# Patient Record
Sex: Female | Born: 1985 | Race: White | Hispanic: No | Smoking: Never smoker
Health system: Southern US, Community
[De-identification: ages and names within clinical notes are randomized; demographics above are authoritative.]

## PROBLEM LIST (undated history)

## (undated) DIAGNOSIS — Z789 Other specified health status: Secondary | ICD-10-CM

## (undated) HISTORY — PX: OTHER SURGICAL HISTORY: SHX169

---

## 2015-03-26 DIAGNOSIS — K8020 Calculus of gallbladder without cholecystitis without obstruction: Secondary | ICD-10-CM

## 2015-03-26 LAB — URINALYSIS W/ REFLEX CULTURE
Blood: NEGATIVE
Glucose: NEGATIVE mg/dL
Nitrites: POSITIVE — AB
Protein: NEGATIVE mg/dL
Specific gravity: 1.03 (ref 1.003–1.030)
Urobilinogen: 1 EU/dL (ref 0.2–1.0)
pH (UA): 6 (ref 5.0–8.0)

## 2015-03-26 LAB — METABOLIC PANEL, COMPREHENSIVE
A-G Ratio: 1 — ABNORMAL LOW (ref 1.1–2.2)
ALT (SGPT): 502 U/L — ABNORMAL HIGH (ref 12–78)
AST (SGOT): 361 U/L — ABNORMAL HIGH (ref 15–37)
Albumin: 4 g/dL (ref 3.5–5.0)
Alk. phosphatase: 220 U/L — ABNORMAL HIGH (ref 45–117)
Anion gap: 6 mmol/L (ref 5–15)
BUN/Creatinine ratio: 12 (ref 12–20)
BUN: 12 MG/DL (ref 6–20)
Bilirubin, total: 5.4 MG/DL — ABNORMAL HIGH (ref 0.2–1.0)
CO2: 30 mmol/L (ref 21–32)
Calcium: 9.2 MG/DL (ref 8.5–10.1)
Chloride: 102 mmol/L (ref 97–108)
Creatinine: 1.01 MG/DL (ref 0.55–1.02)
GFR est AA: >60 mL/min/{1.73_m2} (ref >60)
GFR est non-AA: >60 mL/min/{1.73_m2} (ref >60)
Globulin: 4.1 g/dL — ABNORMAL HIGH (ref 2.0–4.0)
Glucose: 99 mg/dL (ref 65–100)
Potassium: 4.8 mmol/L (ref 3.5–5.1)
Protein, total: 8.1 g/dL (ref 6.4–8.2)
Sodium: 138 mmol/L (ref 136–145)

## 2015-03-26 LAB — CBC WITH AUTOMATED DIFF
ABS. BASOPHILS: 0 10*3/uL (ref 0.0–0.1)
ABS. EOSINOPHILS: 0.2 10*3/uL (ref 0.0–0.4)
ABS. LYMPHOCYTES: 3.4 10*3/uL (ref 0.8–3.5)
ABS. MONOCYTES: 0.7 10*3/uL (ref 0.0–1.0)
ABS. NEUTROPHILS: 3.6 10*3/uL (ref 1.8–8.0)
BASOPHILS: 1 % (ref 0–1)
EOSINOPHILS: 2 % (ref 0–7)
HCT: 41.9 % (ref 35.0–47.0)
HGB: 13.6 g/dL (ref 11.5–16.0)
LYMPHOCYTES: 43 % (ref 12–49)
MCH: 29 PG (ref 26.0–34.0)
MCHC: 32.5 g/dL (ref 30.0–36.5)
MCV: 89.3 FL (ref 80.0–99.0)
MONOCYTES: 9 % (ref 5–13)
NEUTROPHILS: 45 % (ref 32–75)
PLATELET: 344 10*3/uL (ref 150–400)
RBC: 4.69 M/uL (ref 3.80–5.20)
RDW: 13.1 % (ref 11.5–14.5)
WBC: 7.8 10*3/uL (ref 3.6–11.0)

## 2015-03-26 LAB — MAGNESIUM: Magnesium: 2.4 mg/dL (ref 1.6–2.4)

## 2015-03-26 LAB — HCG URINE, QL: HCG urine, QL: NEGATIVE

## 2015-03-26 LAB — BILIRUBIN, CONFIRM: Bilirubin UA, confirm: POSITIVE — AB

## 2015-03-26 LAB — LIPASE: Lipase: 120 U/L (ref 73–393)

## 2015-03-26 NOTE — Consults (Signed)
Surgery      Called by PA in ER regarding this 29 yo female with nl wbc 7.8, bili of 5.4 with elevated lfts, with a US read as"GALLBLADDER: Contains small calculi and biliary sludge. Associated ??  gallbladder tenderness. Thickened gallbladder wall.  COMMON BILE DUCT: 11 mm in diameter.?? No evidence of choledocholithiasis."    Recommended on phone that this is most likely a CBD stone causing obstruction and that the patient will likely need an ERCP as the first step in her management and so GI needs to see patient and likely have patient admitted to Hospitalist and Surgical consult.    Patient seen.  Pain for 3 days.    Workup as above.    NKDA  No Meds  No Hx of surg  Recently moved here from [place] to live with sister, unemployed.  Neg Tob  Neg ETOH      ROS reports neg to all questions    PE: Abd soft, +BS, tender epigastrium to ruq, no guarding or rebound _ Murphy's sign    A/P  All evidence indicates CBD stone with obstruction  Do not feel the patient has acute cholecystitis and would not give antibx at this point  GI eval for ERCP vs MRCP and ERCP if positive.  Would suggest lap chole after CBD cleared unless elevation of LFT is related to a hepatitis    Will follow the patient and await GI workup.          Kelton PillarMichael S. Oletha CruelGrillon, MD, [facility]  [facility]

## 2015-03-26 NOTE — ED Provider Notes (Signed)
HPI Comments: Diane Nelson is a 29 y.o. female presenting ambulatory to ED c/o 9/10 upper abdominal pain R>L x two days. Pt states that the abdominal pain is "under her rib cage". Pt states that her pain is exacerbated when she is laying down or when she moves. Pt states she has had associated symptoms of nausea and vomiting (one episode) yesterday, which has become worse today. Pt denies any injury that could be causing her pain. Pt states that her bowel movements have been tan. Pt denies any history of abdominal problems or chronic medical problems. Pt states that her last menstrual period started five days ago and was normal. Pt states that she does not have a PCP because she is not from the area. She specifically denies any fevers, chills, chest pain, shortness of breath, headache, rash, sweating or weight loss.    Social Hx: - smoking, - alcohol use, - illicit drug use      There are no other complaints, changes, or physical findings at this time.  Written by Blaine Hamper Marijean Bravo, ED Scribe, as dictated by Augusto Garbe.     The history is provided by the patient. No language interpreter was used.        No past medical history on file.    No past surgical history on file.      No family history on file.    History     Social History   ??? Marital Status: SINGLE     Spouse Name: N/A   ??? Number of Children: N/A   ??? Years of Education: N/A     Occupational History   ??? Not on file.     Social History Main Topics   ??? Smoking status: Not on file   ??? Smokeless tobacco: Not on file   ??? Alcohol Use: Not on file   ??? Drug Use: Not on file   ??? Sexual Activity: Not on file     Other Topics Concern   ??? Not on file     Social History Narrative   ??? No narrative on file         ALLERGIES: Review of patient's allergies indicates no known allergies.    Review of Systems   Constitutional: Negative for fever, chills, appetite change and fatigue.   HENT: Negative for congestion, ear pain, postnasal drip, rhinorrhea and  sore throat.    Eyes: Negative for visual disturbance.   Respiratory: Negative for cough, shortness of breath and wheezing.    Cardiovascular: Negative for chest pain, palpitations and leg swelling.   Gastrointestinal: Positive for nausea, vomiting and abdominal pain (upper, R>L). Negative for diarrhea, constipation and anal bleeding.        "tan colored" bowel movements   Genitourinary: Negative for dysuria and hematuria.   Musculoskeletal: Negative for myalgias and arthralgias.   Skin: Negative for rash.   Allergic/Immunologic: Negative for immunocompromised state.   Neurological: Negative for weakness, light-headedness and headaches.   All other systems reviewed and are negative.      Patient Vitals for the past 12 hrs:   Temp Pulse Resp BP SpO2   03/27/15 0040 - (!) 53 14 96/46 mmHg 100 %   03/26/15 1730 97.8 ??F (36.6 ??C) (!) 58 16 142/89 mmHg 94 %            Physical Exam   Constitutional: She is oriented to person, place, and time. She appears well-developed and well-nourished. No distress.   HENT:  Head: Normocephalic and atraumatic.   Right Ear: External ear normal.   Left Ear: External ear normal.   Nose: Nose normal.   Mouth/Throat: Oropharynx is clear and moist.   Eyes: EOM are normal. Pupils are equal, round, and reactive to light.   Neck: Normal range of motion. Neck supple.   Cardiovascular: Normal rate, regular rhythm, normal heart sounds and intact distal pulses.  Exam reveals no gallop and no friction rub.    No murmur heard.  Pulmonary/Chest: Effort normal and breath sounds normal. No stridor. No respiratory distress. She has no wheezes. She has no rales. She exhibits no tenderness.   Abdominal: Soft. Bowel sounds are normal. She exhibits no distension and no mass. There is tenderness. There is no rebound and no guarding.   Diffuse upper abd pain- more focal to epigastric and RUQ. No rebounding or guarding. No CVAT   Musculoskeletal: Normal range of motion. She exhibits no edema or tenderness.    Neurological: She is alert and oriented to person, place, and time. She exhibits normal muscle tone. Coordination normal.   Skin: Skin is warm and dry. No rash noted. She is not diaphoretic. No erythema. No pallor.   Psychiatric: She has a normal mood and affect. Her behavior is normal.   Nursing note and vitals reviewed.       MDM  Number of Diagnoses or Management Options  Choledocholithiasis with acute cholecystitis:   Urinary tract infection, site unspecified:   Diagnosis management comments: DDx: cholecystitis, choledocholithiasis, UTI, constipation, viral syndrome, hepatitis       Amount and/or Complexity of Data Reviewed  Clinical lab tests: ordered and reviewed  Tests in the radiology section of CPT??: ordered and reviewed  Review and summarize past medical records: yes  Discuss the patient with other providers: yes (General surgery, GI, hospitalist)    Patient Progress  Patient progress: stable      Procedures   2216  Pt???s pain is improving with medication.    Consult Note:  11:23 PM  PA-C Maureen Chatters spoke with Dr. Winfred Leeds,  Specialty: General Surgery  Discussed pt's hx, disposition, and available diagnostic and imaging results. Reviewed care plans. Consultant agrees with plans as outlined. Dr. Winfred Leeds would like for GI to be consulted and would like the pt to be admitted by the hospitalist. Dr. Winfred Leeds would like an MRCP for the pt.   Written by Blaine Hamper Marijean Bravo, ED Scribe, as dictated by Augusto Garbe.     Consult Note:  11:33 PM  PA-C Maureen Chatters spoke with Dr. Chancy Milroy,  Specialty: GI  Discussed pt's hx, disposition, and available diagnostic and imaging results. Reviewed care plans. Consultant agrees with plans as outlined. Dr. Chancy Milroy states that he will see the pt and recommends admission to the hospitalist.  Written by Blaine Hamper. Marijean Bravo, ED Scribe, as dictated by Augusto Garbe.     CONSULT NOTE:   11:35 PM  PA-C Maureen Chatters spoke with Dr. Posey Pronto,   Specialty: Hospitalist   Discussed pt's hx, disposition, and available diagnostic and imaging results. Reviewed care plans. Consultant believes that General Surgery should admit the pt.  Written by Molli Knock, ED Scribe, as dictated by Augusto Garbe.    11:43 PM   PA-C Maureen Chatters has discussed admission with the hospitalist and General surgery and has advised that they discuss the admission problem amongst themselves.     LABORATORY TESTS:  Recent Results (from the past 12 hour(s))   CBC WITH  AUTOMATED DIFF    Collection Time: 03/26/15  5:54 PM   Result Value Ref Range    WBC 7.8 3.6 - 11.0 K/uL    RBC 4.69 3.80 - 5.20 M/uL    HGB 13.6 11.5 - 16.0 g/dL    HCT 41.9 35.0 - 47.0 %    MCV 89.3 80.0 - 99.0 FL    MCH 29.0 26.0 - 34.0 PG    MCHC 32.5 30.0 - 36.5 g/dL    RDW 13.1 11.5 - 14.5 %    PLATELET 344 150 - 400 K/uL    NEUTROPHILS 45 32 - 75 %    LYMPHOCYTES 43 12 - 49 %    MONOCYTES 9 5 - 13 %    EOSINOPHILS 2 0 - 7 %    BASOPHILS 1 0 - 1 %    ABS. NEUTROPHILS 3.6 1.8 - 8.0 K/UL    ABS. LYMPHOCYTES 3.4 0.8 - 3.5 K/UL    ABS. MONOCYTES 0.7 0.0 - 1.0 K/UL    ABS. EOSINOPHILS 0.2 0.0 - 0.4 K/UL    ABS. BASOPHILS 0.0 0.0 - 0.1 K/UL   METABOLIC PANEL, COMPREHENSIVE    Collection Time: 03/26/15  5:54 PM   Result Value Ref Range    Sodium 138 136 - 145 mmol/L    Potassium 4.8 3.5 - 5.1 mmol/L    Chloride 102 97 - 108 mmol/L    CO2 30 21 - 32 mmol/L    Anion gap 6 5 - 15 mmol/L    Glucose 99 65 - 100 mg/dL    BUN 12 6 - 20 MG/DL    Creatinine 1.01 0.55 - 1.02 MG/DL    BUN/Creatinine ratio 12 12 - 20      GFR est AA >60 >60 ml/min/1.103m    GFR est non-AA >60 >60 ml/min/1.738m   Calcium 9.2 8.5 - 10.1 MG/DL    Bilirubin, total 5.4 (H) 0.2 - 1.0 MG/DL    ALT 502 (H) 12 - 78 U/L    AST 361 (H) 15 - 37 U/L    Alk. phosphatase 220 (H) 45 - 117 U/L    Protein, total 8.1 6.4 - 8.2 g/dL    Albumin 4.0 3.5 - 5.0 g/dL    Globulin 4.1 (H) 2.0 - 4.0 g/dL    A-G Ratio 1.0 (L) 1.1 - 2.2     LIPASE    Collection Time: 03/26/15  5:54 PM    Result Value Ref Range    Lipase 120 73 - 393 U/L   MAGNESIUM    Collection Time: 03/26/15  5:54 PM   Result Value Ref Range    Magnesium 2.4 1.6 - 2.4 mg/dL   URINALYSIS W/ REFLEX CULTURE    Collection Time: 03/26/15  6:27 PM   Result Value Ref Range    Color AMBER      Appearance CLOUDY (A) CLEAR      Specific gravity 1.030 1.003 - 1.030      pH (UA) 6.0 5.0 - 8.0      Protein NEGATIVE  NEG mg/dL    Glucose NEGATIVE  NEG mg/dL    Ketone TRACE (A) NEG mg/dL    Blood NEGATIVE  NEG      Urobilinogen 1.0 0.2 - 1.0 EU/dL    Nitrites POSITIVE (A) NEG      Leukocyte Esterase SMALL (A) NEG      WBC 5-10 0 - 4 /hpf    RBC 0-5 0 - 5 /hpf  Epithelial cells FEW FEW /lpf    Bacteria 1+ (A) NEG /hpf    UA:UC IF INDICATED URINE CULTURE ORDERED (A) CNI      CA Oxalate Crystals 2+ (A) NEG   HCG URINE, QL    Collection Time: 03/26/15  6:27 PM   Result Value Ref Range    HCG urine, Ql. NEGATIVE  NEG     BILIRUBIN, CONFIRM    Collection Time: 03/26/15  6:27 PM   Result Value Ref Range    Bilirubin UA, confirm POSITIVE (A) NEG         IMAGING RESULTS:  ?? [place] ABD COMP (Final result) Result time: 03/26/15 23:18:04   ?? Final result by Rad Results In Edi (03/26/15 23:18:04)   ?? Narrative:   ?? **Final Report**  ??    ICD Codes / Adm.Diagnosis: 353614 ?? / Abdominal Pain ??Abdominal Pain  Examination: ??US ABDOMEN ??- [id number] - Jul ??5 2016 11:06PM  Accession No: ??[id number]  Reason: ??Abdominal pain      REPORT:  INDICATION: Abdominal pain    TECHNIQUE:  Routine ultrasound images of the abdomen were obtained.     FINDINGS:  GALLBLADDER: Contains small calculi and biliary sludge. Associated   gallbladder tenderness. Thickened gallbladder wall.  COMMON BILE DUCT: 11 mm in diameter. ??No evidence of choledocholithiasis.  LIVER: Normal in size. Normal echogenicity. No focal hepatic mass or   intrahepatic biliary dilatation.   MAIN PORTAL VEIN: Patent. ??Appropriate hepatopetal flow direction.  PANCREAS: No evidence of mass or ductal dilatation.   RIGHT KIDNEY: 10.3 cm in length. ??No hydronephrosis. No evidence of mass or   calculus.  LEFT KIDNEY: 9.4 cm in length. ??No hydronephrosis. No evidence of mass or   calculus.  SPLEEN: 11.9 cm in length. ??No mass.  AORTA: Normal.  INFERIOR VENA CAVA: Patent.  ????    IMPRESSION:   Cholecystolithiasis, with associated gallbladder tenderness and gallbladder   wall thickening, suggestive of acute cholecystitis. Dilated common bile   duct, with no visualized choledocholithiasis.  ????    ??  ??  Signing/Reading Doctor: Eliseo Squires [phone]) ??  ApprovedEliseo Squires [phone]) ??Jul ??5 2016 11:15PM ?? ?? ?? ?? ?? ?? ?? ?? ?? ?? ??            MEDICATIONS GIVEN:  Medications   sodium chloride 0.9 % bolus infusion 1,000 mL (1,000 mL IntraVENous New Bag 03/26/15 2216)   morphine injection 4 mg (4 mg IntraVENous Given 03/26/15 2226)   ondansetron (ZOFRAN) injection 4 mg (4 mg IntraVENous Given 03/26/15 2219)   cefTRIAXone (ROCEPHIN) 1 g in 0.9% sodium chloride (MBP/ADV) 50 mL (1 g IntraVENous New Bag 03/26/15 2216)       IMPRESSION:  1. Choledocholithiasis with acute cholecystitis    2. Urinary tract infection, site unspecified    3. Calculus of gallbladder with biliary obstruction but without cholecystitis    4. LFT elevation        PLAN:  1. Admit to either General Surgery (Dr. Winfred Leeds) or the hospitalist    Admit Note:  1:06 AM  Patient is being admitted to the hospital by either General Surgery or the hospitalist. The results of their tests and reasons for their admission have been discussed with the patient and/or available family. They convey agreement and understanding for the need to be admitted and for their admission diagnosis.   Written by Blaine Hamper Marijean Bravo, ED Scribe, as dictated by Augusto Garbe.  This note is prepared by Jerene Pitch A. Ford, acting as Education administrator for [employer].    PA-C Thayer Headings Manav Pierotti: The scribe's documentation has been prepared under my direction and personally reviewed by me in its entirety. I confirm that  the note above accurately reflects all work, treatment, procedures, and medical decision making performed by me.

## 2015-03-26 NOTE — ED Notes (Signed)
Assumed care of pt from triage.  Pt presents to ED with chief complaint of upper abdominal pain x 2 days. Pt states she feels as it "someone is punching her in the stomach". Pt also reports N/V. Pt states when she takes a deep breath in, the pain gets worse. Pt denies CP.  Pt is A&O x 4.  Pt denies any other symptoms at this time.    Pt resting comfortably on the stretcher in a position of comfort.  Pt in no acute distress at this time.  Call bell within reach.  Side rails x 2.  Cardiac monitor x 3.  Stretcher locked in the lowest position. Pt aware of plan to await for MD/PA-C/NP assessment, and pt/family verbalizes understanding.  Will continue to monitor.

## 2015-03-27 ENCOUNTER — Inpatient Hospital Stay
Admit: 2015-03-27 | Discharge: 2015-04-02 | Disposition: A | Discharge status: Home / Self Care | Payer: MEDICARE | Attending: Family Medicine | Admitting: Family Medicine

## 2015-03-27 LAB — CBC WITH AUTOMATED DIFF
ABS. BASOPHILS: 0.1 10*3/uL (ref 0.0–0.1)
ABS. EOSINOPHILS: 0.1 10*3/uL (ref 0.0–0.4)
ABS. LYMPHOCYTES: 3.4 10*3/uL (ref 0.8–3.5)
ABS. MONOCYTES: 0.5 10*3/uL (ref 0.0–1.0)
ABS. NEUTROPHILS: 2.7 10*3/uL (ref 1.8–8.0)
BASOPHILS: 1 % (ref 0–1)
EOSINOPHILS: 2 % (ref 0–7)
HCT: 45.5 % (ref 35.0–47.0)
HGB: 12.5 g/dL (ref 11.5–16.0)
LYMPHOCYTES: 51 % — ABNORMAL HIGH (ref 12–49)
MCH: 29.9 PG (ref 26.0–34.0)
MCHC: 27.5 g/dL — ABNORMAL LOW (ref 30.0–36.5)
MCV: 108.9 FL — ABNORMAL HIGH (ref 80.0–99.0)
MONOCYTES: 7 % (ref 5–13)
NEUTROPHILS: 39 % (ref 32–75)
PLATELET: 278 10*3/uL (ref 150–400)
RBC: 4.18 M/uL (ref 3.80–5.20)
RDW: 14.4 % (ref 11.5–14.5)
WBC COMMENTS: REACTIVE
WBC: 6.8 10*3/uL (ref 3.6–11.0)

## 2015-03-27 LAB — METABOLIC PANEL, COMPREHENSIVE
A-G Ratio: 0.9 — ABNORMAL LOW (ref 1.1–2.2)
ALT (SGPT): 466 U/L — ABNORMAL HIGH (ref 12–78)
AST (SGOT): 336 U/L — ABNORMAL HIGH (ref 15–37)
Albumin: 3.4 g/dL — ABNORMAL LOW (ref 3.5–5.0)
Alk. phosphatase: 212 U/L — ABNORMAL HIGH (ref 45–117)
Anion gap: 4 mmol/L — ABNORMAL LOW (ref 5–15)
BUN/Creatinine ratio: 10 — ABNORMAL LOW (ref 12–20)
BUN: 8 MG/DL (ref 6–20)
Bilirubin, total: 4.4 MG/DL — ABNORMAL HIGH (ref 0.2–1.0)
CO2: 30 mmol/L (ref 21–32)
Calcium: 8.3 MG/DL — ABNORMAL LOW (ref 8.5–10.1)
Chloride: 109 mmol/L — ABNORMAL HIGH (ref 97–108)
Creatinine: 0.78 MG/DL (ref 0.55–1.02)
GFR est AA: >60 mL/min/{1.73_m2} (ref >60)
GFR est non-AA: >60 mL/min/{1.73_m2} (ref >60)
Globulin: 3.8 g/dL (ref 2.0–4.0)
Glucose: 69 mg/dL (ref 65–100)
Potassium: 4.3 mmol/L (ref 3.5–5.1)
Protein, total: 7.2 g/dL (ref 6.4–8.2)
Sodium: 143 mmol/L (ref 136–145)

## 2015-03-27 MED ORDER — MORPHINE 2 MG/ML INJECTION
2 mg/mL | INTRAMUSCULAR | Status: AC
Start: 2015-03-27 — End: 2015-03-26
  Administered 2015-03-27: 02:00:00 via INTRAVENOUS

## 2015-03-27 MED ORDER — POLYETHYLENE GLYCOL 3350 17 GRAM (100 %) ORAL POWDER PACKET
17 gram | Freq: Every day | ORAL | Status: DC
Start: 2015-03-27 — End: 2015-04-02
  Administered 2015-03-29 – 2015-03-31 (×3): via ORAL

## 2015-03-27 MED ORDER — ONDANSETRON (PF) 4 MG/2 ML INJECTION
4 mg/2 mL | INTRAMUSCULAR | Status: AC
Start: 2015-03-27 — End: 2015-03-26
  Administered 2015-03-27: 02:00:00 via INTRAVENOUS

## 2015-03-27 MED ORDER — SODIUM CHLORIDE 0.9 % IJ SYRG
Freq: Three times a day (TID) | INTRAMUSCULAR | Status: DC
Start: 2015-03-27 — End: 2015-04-02
  Administered 2015-03-27 – 2015-04-02 (×21): via INTRAVENOUS

## 2015-03-27 MED ORDER — METRONIDAZOLE IN SODIUM CHLORIDE (ISO-OSM) 500 MG/100 ML IV PIGGY BACK
500 mg/100 mL | Freq: Three times a day (TID) | INTRAVENOUS | Status: DC
Start: 2015-03-27 — End: 2015-03-30
  Administered 2015-03-27 – 2015-03-30 (×9): via INTRAVENOUS

## 2015-03-27 MED ORDER — DEXTROSE 5% IN NORMAL SALINE IV
INTRAVENOUS | Status: DC
Start: 2015-03-27 — End: 2015-04-02
  Administered 2015-03-27 – 2015-04-02 (×9): via INTRAVENOUS

## 2015-03-27 MED ORDER — SODIUM CHLORIDE 0.9% BOLUS IV
0.9 % | INTRAVENOUS | Status: AC
Start: 2015-03-27 — End: 2015-03-27
  Administered 2015-03-27: 02:00:00 via INTRAVENOUS

## 2015-03-27 MED ORDER — SODIUM CHLORIDE 0.9 % IV
INTRAVENOUS | Status: DC
Start: 2015-03-27 — End: 2015-03-27
  Administered 2015-03-27: 07:00:00 via INTRAVENOUS

## 2015-03-27 MED ORDER — ADV ADDAPTOR
1 gram | Status: AC
Start: 2015-03-27 — End: 2015-03-27
  Administered 2015-03-27: 02:00:00 via INTRAVENOUS

## 2015-03-27 MED ORDER — SODIUM CHLORIDE 0.9 % IV PIGGY BACK
1 gram | INTRAVENOUS | Status: DC
Start: 2015-03-27 — End: 2015-03-30
  Administered 2015-03-28 – 2015-03-30 (×3): via INTRAVENOUS

## 2015-03-27 MED ORDER — SODIUM CHLORIDE 0.9 % IJ SYRG
INTRAMUSCULAR | Status: DC | PRN
Start: 2015-03-27 — End: 2015-04-02

## 2015-03-27 MED ORDER — MORPHINE 2 MG/ML INJECTION
2 mg/mL | Freq: Four times a day (QID) | INTRAMUSCULAR | Status: DC | PRN
Start: 2015-03-27 — End: 2015-03-28
  Administered 2015-03-28: 23:00:00 via INTRAVENOUS

## 2015-03-27 MED FILL — SODIUM CHLORIDE 0.9 % IV: INTRAVENOUS | Qty: 1000

## 2015-03-27 MED FILL — DEXTROSE 5% IN NORMAL SALINE IV: INTRAVENOUS | Qty: 1000

## 2015-03-27 MED FILL — ONDANSETRON (PF) 4 MG/2 ML INJECTION: 4 mg/2 mL | INTRAMUSCULAR | Qty: 2

## 2015-03-27 MED FILL — BD POSIFLUSH NORMAL SALINE 0.9 % INJECTION SYRINGE: INTRAMUSCULAR | Qty: 10

## 2015-03-27 MED FILL — METRONIDAZOLE IN SODIUM CHLORIDE (ISO-OSM) 500 MG/100 ML IV PIGGY BACK: 500 mg/100 mL | INTRAVENOUS | Qty: 100

## 2015-03-27 MED FILL — MORPHINE 2 MG/ML INJECTION: 2 mg/mL | INTRAMUSCULAR | Qty: 2

## 2015-03-27 MED FILL — CEFTRIAXONE 1 GRAM SOLUTION FOR INJECTION: 1 gram | INTRAMUSCULAR | Qty: 1

## 2015-03-27 NOTE — ED Notes (Signed)
Sister Diane Nelson phone number [phone]

## 2015-03-27 NOTE — Progress Notes (Addendum)
GI Consultation Note Diane Nelson)    NAME: Diane Nelson DOB: [birth year] MRN: [id number]   ATTG: Dr. Thayer Dallas PCP: None  Date/Time:  03/27/2015 8:36 AM  Subjective:   REASON FOR CONSULT:        Diane Nelson is a 29 y.o.  W  female who I was asked to see for suspected biliary obstruction, elevated LFTs and cholecystitis.   Diane Nelson reports a few months of relapsing epigastric and RUQ pain, which became extremely severe last night. She presented to the ED for management, where US showed a CBD of 11 mm w/o visualized choledocholithiasis and cholelithiasis, positive [place] Murphy's, and GB wall thickening. Her pain is better this AM, and bili has downtrended slightly. She has some constipation, but denies diarrhea, nausea, vomiting, fevers, chills.       No past medical history on file.   No past surgical history on file.  History   Substance Use Topics   ??? Smoking status: Not on file   ??? Smokeless tobacco: Not on file   ??? Alcohol Use: Not on file      No family history on file.   No Known Allergies   Home Medications:  None     Hospital medications:  Current Facility-Administered Medications   Medication Dose Route Frequency   ??? sodium chloride (NS) flush 5-10 mL  5-10 mL IntraVENous Q8H   ??? sodium chloride (NS) flush 5-10 mL  5-10 mL IntraVENous PRN   ??? metroNIDAZOLE (FLAGYL) IVPB premix 500 mg  500 mg IntraVENous Q8H   ??? cefTRIAXone (ROCEPHIN) 1 g in 0.9% sodium chloride (MBP/ADV) 50 mL  1 g IntraVENous Q24H   ??? morphine injection 1 mg  1 mg IntraVENous Q6H PRN   ??? 0.9% sodium chloride infusion  125 mL/hr IntraVENous CONTINUOUS     REVIEW OF SYSTEMS:          Unable to obtain  ROS due to      mental status change      sedated       intubated       Total of 11 systems reviewed as follows:  Const:  negative fever, negative chills, negative weight loss  Eyes:   negative diplopia or visual changes, negative eye pain  ENT:   negative coryza, negative sore throat  Resp:   negative cough, hemoptysis, dyspnea   Cards:  negative for chest pain, palpitations, lower extremity edema  GU:  negative for frequency, dysuria and hematuria  Skin:   negative for rash and pruritus  Heme:  negative for easy bruising and gum/nose bleeding  MS:  negative for myalgias, arthralgias, back pain and muscle weakness  Neurolo:  negative for headaches, dizziness, vertigo, memory problems   Psych:  negative for feelings of anxiety, depression     Pertinent Positives include : see HPI     Objective:   VITALS:    BP 99/49 mmHg   Pulse 59   Temp(Src) 97.6 ??F (36.4 ??C)   Resp 16   Ht 5' 6"  (1.676 m)   Wt 81.5 kg (179 lb 10.8 oz)   BMI 29.01 kg/m2   SpO2 99%   LMP 03/24/2015 (Exact Date)  Temp (24hrs), Avg:97.6 ??F (36.4 ??C), Min:97.4 ??F (36.3 ??C), Max:97.8 ??F (36.6 ??C)    PHYSICAL EXAM:   General:    Sleepy, recent morphine, cooperative, no distress, appears stated age.     Head:   Normocephalic, without obvious abnormality, atraumatic.  Eyes:   Conjunctivae clear, anicteric  sclerae.  Pupils are equal  Nose:  Nares normal. No drainage or sinus tenderness.  Throat:    Lips, mucosa, and tongue normal.  No Thrush  Neck:  Supple, symmetrical,  no adenopathy, thyroid: non tender  Back:    Symmetric,  No CVA tenderness.  Lungs:   CTA bilaterally.  No wheezing/rhonchi/rales.  Chest wall:  No tenderness or deformity. No Accessory muscle use.  Heart:   Regular rate and rhythm,  no murmur, rub or gallop.  Abdomen:   Soft, tender to palpation in epigastric/RUQ, NABS  Extremities: Atraumatic, No cyanosis.  No edema. No clubbing  Skin:     Texture, turgor normal. No rashes/lesions/jaundice  Lymph: Cervical, supraclavicular normal.  Psych:  Fair insight.  Not depressed.  Not anxious or agitated.  Neurologic: EOMs intact. No facial asymmetry. No aphasia or slurred speech. Normal strength, A/O X 3.     LAB DATA REVIEWED:    Recent Results (from the past 48 hour(s))   CBC WITH AUTOMATED DIFF    Collection Time: 03/26/15  5:54 PM   Result Value Ref Range     WBC 7.8 3.6 - 11.0 K/uL    RBC 4.69 3.80 - 5.20 M/uL    HGB 13.6 11.5 - 16.0 g/dL    HCT 41.9 35.0 - 47.0 %    MCV 89.3 80.0 - 99.0 FL    MCH 29.0 26.0 - 34.0 PG    MCHC 32.5 30.0 - 36.5 g/dL    RDW 13.1 11.5 - 14.5 %    PLATELET 344 150 - 400 K/uL    NEUTROPHILS 45 32 - 75 %    LYMPHOCYTES 43 12 - 49 %    MONOCYTES 9 5 - 13 %    EOSINOPHILS 2 0 - 7 %    BASOPHILS 1 0 - 1 %    ABS. NEUTROPHILS 3.6 1.8 - 8.0 K/UL    ABS. LYMPHOCYTES 3.4 0.8 - 3.5 K/UL    ABS. MONOCYTES 0.7 0.0 - 1.0 K/UL    ABS. EOSINOPHILS 0.2 0.0 - 0.4 K/UL    ABS. BASOPHILS 0.0 0.0 - 0.1 K/UL   METABOLIC PANEL, COMPREHENSIVE    Collection Time: 03/26/15  5:54 PM   Result Value Ref Range    Sodium 138 136 - 145 mmol/L    Potassium 4.8 3.5 - 5.1 mmol/L    Chloride 102 97 - 108 mmol/L    CO2 30 21 - 32 mmol/L    Anion gap 6 5 - 15 mmol/L    Glucose 99 65 - 100 mg/dL    BUN 12 6 - 20 MG/DL    Creatinine 1.01 0.55 - 1.02 MG/DL    BUN/Creatinine ratio 12 12 - 20      GFR est AA >60 >60 ml/min/1.66m    GFR est non-AA >60 >60 ml/min/1.716m   Calcium 9.2 8.5 - 10.1 MG/DL    Bilirubin, total 5.4 (H) 0.2 - 1.0 MG/DL    ALT 502 (H) 12 - 78 U/L    AST 361 (H) 15 - 37 U/L    Alk. phosphatase 220 (H) 45 - 117 U/L    Protein, total 8.1 6.4 - 8.2 g/dL    Albumin 4.0 3.5 - 5.0 g/dL    Globulin 4.1 (H) 2.0 - 4.0 g/dL    A-G Ratio 1.0 (L) 1.1 - 2.2     LIPASE    Collection Time: 03/26/15  5:54 PM   Result Value Ref Range  Lipase 120 73 - 393 U/L   MAGNESIUM    Collection Time: 03/26/15  5:54 PM   Result Value Ref Range    Magnesium 2.4 1.6 - 2.4 mg/dL   URINALYSIS W/ REFLEX CULTURE    Collection Time: 03/26/15  6:27 PM   Result Value Ref Range    Color AMBER      Appearance CLOUDY (A) CLEAR      Specific gravity 1.030 1.003 - 1.030      pH (UA) 6.0 5.0 - 8.0      Protein NEGATIVE  NEG mg/dL    Glucose NEGATIVE  NEG mg/dL    Ketone TRACE (A) NEG mg/dL    Blood NEGATIVE  NEG      Urobilinogen 1.0 0.2 - 1.0 EU/dL    Nitrites POSITIVE (A) NEG       Leukocyte Esterase SMALL (A) NEG      WBC 5-10 0 - 4 /hpf    RBC 0-5 0 - 5 /hpf    Epithelial cells FEW FEW /lpf    Bacteria 1+ (A) NEG /hpf    UA:UC IF INDICATED URINE CULTURE ORDERED (A) CNI      CA Oxalate Crystals 2+ (A) NEG   HCG URINE, QL    Collection Time: 03/26/15  6:27 PM   Result Value Ref Range    HCG urine, Ql. NEGATIVE  NEG     BILIRUBIN, CONFIRM    Collection Time: 03/26/15  6:27 PM   Result Value Ref Range    Bilirubin UA, confirm POSITIVE (A) NEG     CBC WITH AUTOMATED DIFF    Collection Time: 03/27/15  5:26 AM   Result Value Ref Range    WBC 6.8 3.6 - 11.0 K/uL    RBC 4.18 3.80 - 5.20 M/uL    HGB 12.5 11.5 - 16.0 g/dL    HCT 45.5 35.0 - 47.0 %    MCV 108.9 (H) 80.0 - 99.0 FL    MCH 29.9 26.0 - 34.0 PG    MCHC 27.5 (L) 30.0 - 36.5 g/dL    RDW 14.4 11.5 - 14.5 %    PLATELET 278 150 - 400 K/uL    NEUTROPHILS 39 32 - 75 %    LYMPHOCYTES 51 (H) 12 - 49 %    MONOCYTES 7 5 - 13 %    EOSINOPHILS 2 0 - 7 %    BASOPHILS 1 0 - 1 %    ABS. NEUTROPHILS 2.7 1.8 - 8.0 K/UL    ABS. LYMPHOCYTES 3.4 0.8 - 3.5 K/UL    ABS. MONOCYTES 0.5 0.0 - 1.0 K/UL    ABS. EOSINOPHILS 0.1 0.0 - 0.4 K/UL    ABS. BASOPHILS 0.1 0.0 - 0.1 K/UL    RBC COMMENTS MACROCYTOSIS  PRESENT        WBC COMMENTS REACTIVE LYMPHS      DF SMEAR SCANNED     METABOLIC PANEL, COMPREHENSIVE    Collection Time: 03/27/15  5:26 AM   Result Value Ref Range    Sodium 143 136 - 145 mmol/L    Potassium 4.3 3.5 - 5.1 mmol/L    Chloride 109 (H) 97 - 108 mmol/L    CO2 30 21 - 32 mmol/L    Anion gap 4 (L) 5 - 15 mmol/L    Glucose 69 65 - 100 mg/dL    BUN 8 6 - 20 MG/DL    Creatinine 0.78 0.55 - 1.02 MG/DL    BUN/Creatinine ratio  10 (L) 12 - 20      GFR est AA >60 >60 ml/min/1.1m    GFR est non-AA >60 >60 ml/min/1.757m   Calcium 8.3 (L) 8.5 - 10.1 MG/DL    Bilirubin, total 4.4 (H) 0.2 - 1.0 MG/DL    ALT 466 (H) 12 - 78 U/L    AST 336 (H) 15 - 37 U/L    Alk. phosphatase 212 (H) 45 - 117 U/L    Protein, total 7.2 6.4 - 8.2 g/dL    Albumin 3.4 (L) 3.5 - 5.0 g/dL     Globulin 3.8 2.0 - 4.0 g/dL    A-G Ratio 0.9 (L) 1.1 - 2.2       IMAGING RESULTS:  [place]Q:   "FINDINGS:  GALLBLADDER: Contains small calculi and biliary sludge. Associated ??  gallbladder tenderness. Thickened gallbladder wall.  COMMON BILE DUCT: 11 mm in diameter.?? No evidence of choledocholithiasis.  LIVER: Normal in size. Normal echogenicity. No focal hepatic mass or ??  intrahepatic biliary dilatation. ??  MAIN PORTAL VEIN: Patent.?? Appropriate hepatopetal flow direction.  PANCREAS: No evidence of mass or ductal dilatation.  RIGHT KIDNEY: 10.3 cm in length.?? No hydronephrosis. No evidence of mass or ??  calculus.  LEFT KIDNEY: 9.4 cm in length.?? No hydronephrosis. No evidence of mass or ??  calculus.  SPLEEN: 11.9 cm in length.?? No mass.  AORTA: Normal.  INFERIOR VENA CAVA: Patent  ??  IMPRESSION: ??  Cholecystolithiasis, with associated gallbladder tenderness and gallbladder ??  wall thickening, suggestive of acute cholecystitis. Dilated common bile ??  duct, with no visualized choledocholithiasis."    Assessment/Plan:      Active Problems:    Cholelithiasis (03/26/2015)      LFT elevation (03/26/2015)    89o F with suspected cholecystitis, with likely prior vs current CBD obstruction. Bili has downtrended and aminotransferases down a little too. CBD stone may have passed, but will need to clarify with MRCP. No fever, no AMS, stable hemodynamics on prn morphine.  ___________________________________________________  RECOMMENDATIONS:      -- agree with empiric abx  -- MRCP ordered to clarify if ERCP needed  -- follow LFTs   -- suspect will need cholecystectomy, after ERCP if required (I did review what an ERCP entails, risks, benefits with patient at bedside today)    Discussed Code Status:        Full Code          DNR    ___________________________________________________  Care Plan discussed with:        Patient       Family       Nursing       Attending  Total Time :  30   minutes    ___________________________________________________  GI: AlMacie BurowsMD

## 2015-03-27 NOTE — Other (Signed)
[facility] Telemetry Unit Interdisciplinary rounds were held today to discuss patient plan of care and outcomes. The interdisciplinary team collaborated to facilitate discharge planning needs. The following members were present; Nurse Manager, Clinical Care Lead, Pharmacist, Primary Nurse, , Physical Therapy,   Physician, and Case Management.     PLAN:  MRCP  Discharge home when ready

## 2015-03-27 NOTE — ED Notes (Signed)
Hourly rounds completed. Concerns and questions addressed at this time. Pt in no acute distress at this time. Call bell within reach. Side rails x 2. Cardiac Monitor x 3. Stretcher locked in lowest position.

## 2015-03-27 NOTE — Progress Notes (Signed)
Surgery      Prelim MRCP report"Small gallstones without evidence for choledocholithiasis"    Hopefully final report by AM    Check LFT in AM    If correcting and final MRCP shows no evidence of CBD stone then will sched for LAP Chole.    NPO after MN      Kelton PillarMichael S. Oletha CruelGrillon,  MD, [facility]  [facility]

## 2015-03-27 NOTE — Progress Notes (Signed)
Initial Nutrition Assessment:    INTERVENTIONS/RECOMMENDATIONS:   ?? Meals/Snacks: General/healthful diet: Advance diet as medically able.     ASSESSMENT:   Patient medically noted for common bile duct obstruction, cholelithiasis, and elevated LFT's. Patient currently NPO. She reports feeling hungry this morning. Poor appetite noted PTA with reported 15# weight loss x approximately 3 months. Will monitor for diet advancement and need for ONS.     Diet Order: NPO  % Eaten:  No data found.    Pertinent Medications: Reviewed Other: Flagyl  Pertinent Labs: Reviewed Other  Food Allergies: None Other   Last BM:    Active     Hyperactive  Hypoactive        Absent BS  Skin:     Intact    Incision   Breakdown   Other:    Anthropometrics:   Height:  (167.6 cm) Weight: 81.5 kg (179 lb 10.8 oz)   IBW (%IBW):   ( ) UBW (%UBW):   (  %)   Last Weight Metrics:  Weight Loss Metrics 03/26/2015   Today's Wt 179 lb 10.8 oz   BMI 29.01 kg/m2       BMI: Body mass index is 29.01 kg/(m^2).    This BMI is indicative of:   Underweight    Normal    Overweight     Obesity    Extreme Obesity (BMI>40)     Estimated Nutrition Needs (Based on):   1800 Kcals/day (BMR (1577) x 1.3AF -250kcal) , 66 g (0.8 g/kg bw) Protein  Carbohydrate: At Least 130 g/day  Fluids: 1800 mL/day (86ml/kcal)    Pt expected to meet estimated nutrient needs: Yes No: currently NPO    NUTRITION DIAGNOSES:   Problem:  Inadequate protein-energy intake Unintended weight loss    Etiology: related to current medical condition poor appetite    Signs/Symptoms: as evidenced by NPO status  reported 15# weight loss x 3 months (7.7%)    NUTRITION INTERVENTIONS:  Meals/Snacks: General/healthful diet                  GOAL:   Diet advanced next 1-3 days     LEARNING NEEDS (Diet, Food/Nutrient-Drug Interaction):     None Identified    Identified and Education Provided/Documented    Identified and Pt declined/was not appropriate     Cultureal, Religious, OR Ethnic Dietary Needs:      None Identified    Identified and Addressed      Interdisciplinary Care Plan Reviewed/Documented     Discharge Planning:  Resume regular diet as tolerated     MONITORING /EVALUATION:   Food/Nutrient Intake Outcomes: Total energy intake  Physical Signs/Symptoms Outcomes: Weight/weight change    NUTRITION RISK:     High               Moderate             Low    Minimal/Uncompromised    PT SEEN FOR:      MD Consult: Calorie Count      Diabetic Diet Education        Diet Education     Electrolyte Management     General Nutrition Management and Supplements     Management of Tube Feeding     TPN Recommendations      RN Referral:  MST score >=2     Enteral/Parenteral Nutrition PTA     Pregnant: Gestational DM or Multigestation     Pressure Ulcer/Wound Care  needs          Low BMI    DTR Referral       Oscar LaKori L George  Pager [phone]  Weekend Pager [phone]

## 2015-03-27 NOTE — Progress Notes (Signed)
Surgery      Patient reports feeling better this morning.    Less tenderness on exam this morning    WBC 6.8    Afeb    LFT still elevated    GI has seen patuient and have ordered MRCP.    Await these findings to determine if ERCP will be done and then timing for Lap chole because of cholelithiasis.    Will continue to follow          Diane PillarMichael S. Oletha CruelGrillon, MD, [facility]  [facility]

## 2015-03-27 NOTE — Consults (Signed)
Please see progress note from today.

## 2015-03-27 NOTE — Progress Notes (Addendum)
The patient came to the ED with CBD dilated with elevated bililubin,Elevated liver enzyme, and   Suspected cholecystitis.  The patient states she had insurance in [place] but does not know what it was.  She also has disability.  CM contacted Ms Shawnie DapperLopez, [employer] who checked and the patient has [facility] Medicare. She will check to see if she also had Medicaid there and let CM know.  CM informed the patient who verbalized she had been concerned about getting a huge bill if she had to have surgery.   CM informed her nurse and VUR as well.   Care Management Interventions  PCP Verified by CM: No (no pcp -[place]  Palliative Care Consult: No  Current Support Network: Relative's Home ([place] and is living with her sister.  Was independent in her care, does not drive)   C. Poythress RN [phone]

## 2015-03-27 NOTE — ED Notes (Signed)
Bedside and verbal report given to Erskine SquibbJane, RN on Haze JustinSarah Coller at shift change for routine progression of care.  Report consisted of patient???s Situation, Background, Assessment and Recommendations(SBAR).  Information from the following report(s) SBAR, ED Summary, [facility] and Recent Results was reviewed with the receiving nurse.  Opportunity for questions and clarification was provided.

## 2015-03-27 NOTE — Other (Signed)
-  Please complete MRI History and Safety Screening Form for this patient using [employer] only under Orders Requiring a Screening Form:    Example:  Orders Requiring a Screening Form     Procedure Order Status Form Status    MRI EXAM Active In Progress     - Answer all questions completely, including Weight, Surgery, Allergy, and Implant History.   - Document MUST be "eSigned" using a "Signature Pad" by the person completing form, the patient, and the RN or MD completing form with the patient.  - Patient cannot be scanned until this form is completed and reviewed in MRI to ensure patient is SAFE and eligible for MRI.  - CALL MRI when this has been successfully completed at [phone].  - This must be done under KARDEX. Do not use the Nursing Flowsheets.

## 2015-03-27 NOTE — Progress Notes (Addendum)
Hospitalist Progress Note    NAME: Diane Nelson   DOB:  Jan 13, 1986   MRN:  [id number]       Interim Hospital Summary: 29 y.o. female whom presented on 03/26/2015 with abd pain      Assessment / Plan:  Likely CBD obstruction leading to acute cholecystitis   Cholelithiasis  Elevated LFT's   Diet: NPO  IVF ( adding d5w in IVF)   Pain meds: effective   GI help appreciated: follow MRCP to decide on ERCP  Cont empiric AB: CTX + flagyl     R/o UTI  Follow UC, CTX will cover for now     Macrocytosis  Check TSH/B12/folate     Sinus bradycardia  On admission at 50[facility], asymptomatic  Watch for now. If not improving will consider cardiology eval.         Code status: Full   Surrogate Decision Maker: sister  Prophylaxis: SCD's and a/coag on hold pending decisions on ERCP and surgery ; ambulate in hall TID    Baseline: lives with sister; on disability for slow learning   Recommended Disposition: Home w/Family     Subjective:     Chief Complaint / Reason for Physician Visit: following cholecystitis/ uti  " I am sleepy"  abd pain persist  No N/V     Discussed with RN events overnight.     Review of Systems:  Symptom Y/N Comments  Symptom Y/N Comments   Fever/Chills n   Chest Pain n    Poor Appetite    Edema     Cough    Abdominal Pain y    Sputum    Joint Pain     SOB/DOE n   Pruritis/Rash     Nausea/vomit n   Tolerating PT/OT  Independent    Diarrhea n   Tolerating Diet  NPO   Constipation n   Other       Could NOT obtain due to:      Objective:     VITALS:   Last 24hrs VS reviewed since prior progress note. Most recent are:  Patient Vitals for the past 24 hrs:   Temp Pulse Resp BP SpO2   03/27/15 0739 97.6 ??F (36.4 ??C) (!) 59 16 99/49 mmHg 99 %   03/27/15 0501 97.4 ??F (36.3 ??C) 65 18 108/71 mmHg 95 %   03/27/15 0340 - (!) 52 19 108/52 mmHg 98 %   03/27/15 0325 - (!) 46 15 103/45 mmHg 99 %   03/27/15 0310 - (!) 46 10 - 99 %   03/27/15 0255 - (!) 48 10 109/49 mmHg 100 %    03/27/15 0210 - (!) 48 11 102/45 mmHg 99 %   03/27/15 0110 - (!) 48 11 106/48 mmHg 99 %   03/27/15 0040 - (!) 53 14 96/46 mmHg 100 %   03/26/15 1730 97.8 ??F (36.6 ??C) (!) 58 16 142/89 mmHg 94 %     No intake or output data in the 24 hours ending 03/27/15 0955     PHYSICAL EXAM:  General: WD, WN. Alert, cooperative, no acute distress????  EENT:  EOMI. Anicteric sclerae. MMM  Resp:  CTA bilaterally, no wheezing or rales.  No accessory muscle use  CV:  Regular  rhythm,?? No edema  GI:  Soft, Non distended, + mild RUQ tender, no guardian. ??+Bowel sounds  Neurologic:?? Alert and oriented X 3, normal speech,   Psych:???? Good insight.??Not anxious nor agitated  Skin:  No rashes.  No  jaundice    Reviewed most current lab test results and cultures  YES  Reviewed most current radiology test results   YES  Review and summation of old records today    NO  Reviewed patient's current orders and MAR    YES  PMH/SH reviewed - no change compared to H&P  ________________________________________________________________________  Care Plan discussed with:    Comments   Patient y    Family      RN y    Buyer, retail                        Multidiciplinary team rounds were held today with case manager, nursing, pharmacist and Higher education careers adviser.  Patient's plan of care was discussed; medications were reviewed and discharge planning was addressed.     ________________________________________________________________________  Total NON critical care TIME:  40  Minutes    Total CRITICAL CARE TIME Spent:   Minutes non procedure based      Comments   >50% of visit spent in counseling and coordination of care     ________________________________________________________________________  Guerry Bruin, MD     Procedures: see electronic medical records for all procedures/Xrays and details which were not copied into this note but were reviewed prior to creation of Plan.      LABS:   I reviewed today's most current labs and imaging studies.  Pertinent labs include:  Recent Labs      03/27/15   0526  03/26/15   1754   WBC  6.8  7.8   HGB  12.5  13.6   HCT  45.5  41.9   PLT  278  344     Recent Labs      03/27/15   0526  03/26/15   1754   NA  143  138   K  4.3  4.8   CL  109*  102   CO2  30  30   GLU  69  99   BUN  8  12   CREA  0.78  1.01   CA  8.3*  9.2   MG   --   2.4   ALB  3.4*  4.0   TBILI  4.4*  5.4*   SGOT  336*  361*   ALT  466*  502*

## 2015-03-27 NOTE — H&P (Addendum)
Hospitalist Admission Note    NAME: Diane Nelson   DOB:  Dec 28, 1985   MRN:  [id number]     Date/Time:  03/27/2015 2:30 AM    Patient PCP: None  ________________________________________________________________________    My assessment of this patient's clinical condition and my plan of care is as follows.    Assessment / Plan:  CBD dilated with elevated bililubin  Elevated liver enzyme  Suspected cholecystitis    Plan   Will admit to telemetry per surgeon and GI request  Keep npo  Iv hydration   Cont rocephin and metronidazole   May need mrcp will defer to GI evaluation   Pain control   Dr. Verdis Frederickson resume care at 7.00 am    Code Status: full   Surrogate Decision Maker: sister    DVT Prophylaxis: scd  GI Prophylaxis: not indicated    Baseline: normal        Subjective:   CHIEF COMPLAINT: abdominal pain for 3 days    HISTORY OF PRESENT ILLNESS:     Diane Nelson is a 29 y.o.  Caucasian female who presents with progressive abdominal pain for the past 3 days. She described the pain as cramping around epigastric area associate with nausea but no vomiting.   Patient denies fever chills. But her pain persistent so decide to come to ed.  Her worked up in ED showed Cholecystolithiasis, with associated gallbladder tenderness and gallbladder ??  wall thickening, suggestive of acute cholecystitis. Dilated common bile ??duct, with no visualized choledocholithiasis.  ED discussed with general surgeon and GI and they recommend to admit to hospitalist service.  At the time of my evaluation patient patient is control and she received rocephin and morphine.   We were asked to admit for work up and evaluation of the above problems.     No past medical history on file. PMH none    No past surgical history on file. PSH none    History   Substance Use Topics   ??? Smoking status: Not on file   ??? Smokeless tobacco: Not on file   ??? Alcohol Use: Not on file        Family history no liver disease no major medical problems   No Known Allergies     Prior to Admission medications    Not on File       REVIEW OF SYSTEMS:     I am not able to complete the review of systems because:   The patient is intubated and sedated    The patient has altered mental status due to his acute medical problems    The patient has baseline aphasia from prior stroke(s)    The patient has baseline dementia and is not reliable historian    The patient is in acute medical distress and unable to provide information           Total of 12 systems reviewed as follows:       POSITIVE= underlined text  Negative = text not underlined  General:  fever, chills, sweats, generalized weakness, weight loss/gain,      loss of appetite   Eyes:    blurred vision, eye pain, loss of vision, double vision  ENT:    rhinorrhea, pharyngitis   Respiratory:   cough, sputum production, SOB, DOE, wheezing, pleuritic pain   Cardiology:   chest pain, palpitations, orthopnea, PND, edema, syncope   Gastrointestinal:  abdominal pain , N/V, diarrhea, dysphagia, constipation, bleeding   Genitourinary:  frequency,  urgency, dysuria, hematuria, incontinence   Muskuloskeletal :  arthralgia, myalgia, back pain  Hematology:  easy bruising, nose or gum bleeding, lymphadenopathy   Dermatological: rash, ulceration, pruritis, color change / jaundice  Endocrine:   hot flashes or polydipsia   Neurological:  headache, dizziness, confusion, focal weakness, paresthesia,     Speech difficulties, memory loss, gait difficulty  Psychological: Feelings of anxiety, depression, agitation    Objective:   VITALS:    Visit Vitals   Item Reading   ??? BP 96/46 mmHg   ??? Pulse 53   ??? Temp 97.8 ??F (36.6 ??C)   ??? Resp 14   ??? Ht 5' 6"  (1.676 m)   ??? Wt 81.5 kg (179 lb 10.8 oz)   ??? BMI 29.01 kg/m2   ??? SpO2 100%       PHYSICAL EXAM:    General:    Alert, cooperative, no distress, appears stated age.     HEENT: Atraumatic, anicteric sclerae, pink conjunctivae     No oral ulcers, mucosa moist, throat clear, dentition fair   Neck:  Supple, symmetrical,  thyroid: non tender  Lungs:   Clear to auscultation bilaterally.  No Wheezing or Rhonchi. No rales.  Chest wall:  No tenderness  No Accessory muscle use.  Heart:   Regular  rhythm,  No  murmur   No edema  Abdomen:   Soft, non-tender no rebound tenderness. Not distended.  Bowel sounds normal  Extremities: No cyanosis.  No clubbing      Capillary refill normal,  Radial pulse 2+  Skin:     Not pale.  Not Jaundiced  No rashes   Psych:  Good insight.  Not depressed.  Not anxious or agitated.  Neurologic: EOMs intact. No facial asymmetry. No aphasia or slurred speech. Symmetrical strength, Sensation grossly intact. Alert and oriented X 4.     _______________________________________________________________________  Care Plan discussed with:    Comments   Patient x    Family      RN x    Care Manager                    Consultant:  x  surgeon   _______________________________________________________________________  Expected  Disposition:   Home with Family x   HH/PT/OT/RN    SNF/LTC    SAHR    ________________________________________________________________________  TOTAL TIME: 28 Minutes    Critical Care Provided     Minutes non procedure based      Comments     Reviewed previous records   >50% of visit spent in counseling and coordination of care  Discussion with patient and/or family and questions answered       ________________________________________________________________________  Mliss Sax, MD    Procedures: see electronic medical records for all procedures/Xrays and details which were not copied into this note but were reviewed prior to creation of Plan.    LAB DATA REVIEWED:    Recent Results (from the past 24 hour(s))   CBC WITH AUTOMATED DIFF    Collection Time: 03/26/15  5:54 PM   Result Value Ref Range    WBC 7.8 3.6 - 11.0 K/uL    RBC 4.69 3.80 - 5.20 M/uL    HGB 13.6 11.5 - 16.0 g/dL    HCT 41.9 35.0 - 47.0 %    MCV 89.3 80.0 - 99.0 FL    MCH 29.0 26.0 - 34.0 PG     MCHC 32.5 30.0 - 36.5 g/dL    RDW  13.1 11.5 - 14.5 %    PLATELET 344 150 - 400 K/uL    NEUTROPHILS 45 32 - 75 %    LYMPHOCYTES 43 12 - 49 %    MONOCYTES 9 5 - 13 %    EOSINOPHILS 2 0 - 7 %    BASOPHILS 1 0 - 1 %    ABS. NEUTROPHILS 3.6 1.8 - 8.0 K/UL    ABS. LYMPHOCYTES 3.4 0.8 - 3.5 K/UL    ABS. MONOCYTES 0.7 0.0 - 1.0 K/UL    ABS. EOSINOPHILS 0.2 0.0 - 0.4 K/UL    ABS. BASOPHILS 0.0 0.0 - 0.1 K/UL   METABOLIC PANEL, COMPREHENSIVE    Collection Time: 03/26/15  5:54 PM   Result Value Ref Range    Sodium 138 136 - 145 mmol/L    Potassium 4.8 3.5 - 5.1 mmol/L    Chloride 102 97 - 108 mmol/L    CO2 30 21 - 32 mmol/L    Anion gap 6 5 - 15 mmol/L    Glucose 99 65 - 100 mg/dL    BUN 12 6 - 20 MG/DL    Creatinine 1.01 0.55 - 1.02 MG/DL    BUN/Creatinine ratio 12 12 - 20      GFR est AA >60 >60 ml/min/1.66m    GFR est non-AA >60 >60 ml/min/1.732m   Calcium 9.2 8.5 - 10.1 MG/DL    Bilirubin, total 5.4 (H) 0.2 - 1.0 MG/DL    ALT 502 (H) 12 - 78 U/L    AST 361 (H) 15 - 37 U/L    Alk. phosphatase 220 (H) 45 - 117 U/L    Protein, total 8.1 6.4 - 8.2 g/dL    Albumin 4.0 3.5 - 5.0 g/dL    Globulin 4.1 (H) 2.0 - 4.0 g/dL    A-G Ratio 1.0 (L) 1.1 - 2.2     LIPASE    Collection Time: 03/26/15  5:54 PM   Result Value Ref Range    Lipase 120 73 - 393 U/L   MAGNESIUM    Collection Time: 03/26/15  5:54 PM   Result Value Ref Range    Magnesium 2.4 1.6 - 2.4 mg/dL   URINALYSIS W/ REFLEX CULTURE    Collection Time: 03/26/15  6:27 PM   Result Value Ref Range    Color AMBER      Appearance CLOUDY (A) CLEAR      Specific gravity 1.030 1.003 - 1.030      pH (UA) 6.0 5.0 - 8.0      Protein NEGATIVE  NEG mg/dL    Glucose NEGATIVE  NEG mg/dL    Ketone TRACE (A) NEG mg/dL    Blood NEGATIVE  NEG      Urobilinogen 1.0 0.2 - 1.0 EU/dL    Nitrites POSITIVE (A) NEG      Leukocyte Esterase SMALL (A) NEG      WBC 5-10 0 - 4 /hpf    RBC 0-5 0 - 5 /hpf    Epithelial cells FEW FEW /lpf    Bacteria 1+ (A) NEG /hpf     UA:UC IF INDICATED URINE CULTURE ORDERED (A) CNI      CA Oxalate Crystals 2+ (A) NEG   HCG URINE, QL    Collection Time: 03/26/15  6:27 PM   Result Value Ref Range    HCG urine, Ql. NEGATIVE  NEG     BILIRUBIN, CONFIRM    Collection Time: 03/26/15  6:27 PM  Result Value Ref Range    Bilirubin UA, confirm POSITIVE (A) NEG

## 2015-03-27 NOTE — ED Notes (Signed)
Provided pt with blanket.

## 2015-03-27 NOTE — Other (Signed)
TRANSFER - OUT REPORT:    Verbal report given to Diane CornfieldStephanie, RN on Diane Nelson  being transferred to [facility] for routine progression of care       Report consisted of patient???s Situation, Background, Assessment and   Recommendations(SBAR).     Information from the following report(s) SBAR, ED Summary, [facility] and Recent Results was reviewed with the receiving nurse.    Lines:       Opportunity for questions and clarification was provided.      Patient transported with:   Monitor  Registered Nurse

## 2015-03-27 NOTE — Progress Notes (Signed)
Bedside and Verbal shift change report given to Dana, RN (oncoming nurse) by Stephanie RN (offgoing nurse).  Report given with SBAR, Kardex, Intake/Output, MAR and Recent Results.

## 2015-03-28 LAB — METABOLIC PANEL, COMPREHENSIVE
A-G Ratio: 0.9 — ABNORMAL LOW (ref 1.1–2.2)
ALT (SGPT): 368 U/L — ABNORMAL HIGH (ref 12–78)
AST (SGOT): 175 U/L — ABNORMAL HIGH (ref 15–37)
Albumin: 3.2 g/dL — ABNORMAL LOW (ref 3.5–5.0)
Alk. phosphatase: 194 U/L — ABNORMAL HIGH (ref 45–117)
Anion gap: 8 mmol/L (ref 5–15)
BUN/Creatinine ratio: 7 — ABNORMAL LOW (ref 12–20)
BUN: 5 MG/DL — ABNORMAL LOW (ref 6–20)
Bilirubin, total: 1.8 MG/DL — ABNORMAL HIGH (ref 0.2–1.0)
CO2: 26 mmol/L (ref 21–32)
Calcium: 8.3 MG/DL — ABNORMAL LOW (ref 8.5–10.1)
Chloride: 106 mmol/L (ref 97–108)
Creatinine: 0.68 MG/DL (ref 0.55–1.02)
GFR est AA: >60 mL/min/{1.73_m2} (ref >60)
GFR est non-AA: >60 mL/min/{1.73_m2} (ref >60)
Globulin: 3.6 g/dL (ref 2.0–4.0)
Glucose: 87 mg/dL (ref 65–100)
Potassium: 3.8 mmol/L (ref 3.5–5.1)
Protein, total: 6.8 g/dL (ref 6.4–8.2)
Sodium: 140 mmol/L (ref 136–145)

## 2015-03-28 LAB — CBC W/O DIFF
HCT: 36.5 % (ref 35.0–47.0)
HGB: 12.2 g/dL (ref 11.5–16.0)
MCH: 29.8 PG (ref 26.0–34.0)
MCHC: 33.4 g/dL (ref 30.0–36.5)
MCV: 89.2 FL (ref 80.0–99.0)
PLATELET: 279 10*3/uL (ref 150–400)
RBC: 4.09 M/uL (ref 3.80–5.20)
RDW: 13 % (ref 11.5–14.5)
WBC: 5 10*3/uL (ref 3.6–11.0)

## 2015-03-28 LAB — VITAMIN B12: Vitamin B12: 611 pg/mL (ref 211–911)

## 2015-03-28 LAB — T4, FREE: T4, Free: 1.5 NG/DL (ref 0.8–1.5)

## 2015-03-28 LAB — TSH 3RD GENERATION: TSH: 0.2 u[IU]/mL — ABNORMAL LOW (ref 0.36–3.74)

## 2015-03-28 LAB — CULTURE, URINE
Colonies Counted: >100000
Colony Count: >100000

## 2015-03-28 LAB — FOLATE: Folate: 29.6 ng/mL — ABNORMAL HIGH (ref 5.0–21.0)

## 2015-03-28 LAB — MAGNESIUM: Magnesium: 2 mg/dL (ref 1.6–2.4)

## 2015-03-28 LAB — LIPASE: Lipase: 85 U/L (ref 73–393)

## 2015-03-28 MED ORDER — D5-NS WITH POTASSIUM CHLORIDE 20 MEQ/L IV
20 mEq/L | INTRAVENOUS | Status: AC
Start: 2015-03-28 — End: 2015-03-29
  Administered 2015-03-28: 22:00:00

## 2015-03-28 MED ORDER — PROPOFOL 10 MG/ML IV EMUL
10 mg/mL | INTRAVENOUS | Status: DC | PRN
Start: 2015-03-28 — End: 2015-03-28
  Administered 2015-03-28: 19:00:00 via INTRAVENOUS

## 2015-03-28 MED ORDER — ROCURONIUM 10 MG/ML IV
10 mg/mL | INTRAVENOUS | Status: DC | PRN
Start: 2015-03-28 — End: 2015-03-28
  Administered 2015-03-28 (×2): via INTRAVENOUS

## 2015-03-28 MED ORDER — LACTATED RINGERS IV
INTRAVENOUS | Status: DC
Start: 2015-03-28 — End: 2015-03-30
  Administered 2015-03-28: 18:00:00 via INTRAVENOUS

## 2015-03-28 MED ORDER — SUCCINYLCHOLINE CHLORIDE 20 MG/ML INJECTION
20 mg/mL | INTRAMUSCULAR | Status: AC
Start: 2015-03-28 — End: ?

## 2015-03-28 MED ORDER — FENTANYL CITRATE (PF) 50 MCG/ML IJ SOLN
50 mcg/mL | INTRAMUSCULAR | Status: AC
Start: 2015-03-28 — End: ?

## 2015-03-28 MED ORDER — BUPIVACAINE-EPINEPHRINE (PF) 0.25 %-1:200,000 IJ SOLN
0.25 %-1:200,000 | INTRAMUSCULAR | Status: AC
Start: 2015-03-28 — End: ?

## 2015-03-28 MED ORDER — FENTANYL CITRATE (PF) 50 MCG/ML IJ SOLN
50 mcg/mL | INTRAMUSCULAR | Status: DC | PRN
Start: 2015-03-28 — End: 2015-03-28
  Administered 2015-03-28: 22:00:00 via INTRAVENOUS

## 2015-03-28 MED ORDER — LACTATED RINGERS IV
INTRAVENOUS | Status: DC
Start: 2015-03-28 — End: 2015-03-28
  Administered 2015-03-28: 22:00:00 via INTRAVENOUS

## 2015-03-28 MED ORDER — MEPERIDINE (PF) 25 MG/ML INJ SOLUTION
25 mg/ml | INTRAMUSCULAR | Status: DC | PRN
Start: 2015-03-28 — End: 2015-03-28

## 2015-03-28 MED ORDER — IOTHALAMATE MEGLUMINE 60 % INJECTION
60 % | Freq: Once | INTRAMUSCULAR | Status: AC
Start: 2015-03-28 — End: 2015-03-28
  Administered 2015-03-28 (×2)

## 2015-03-28 MED ORDER — ACETAMINOPHEN 1,000 MG/100 ML (10 MG/ML) IV
1000 mg/100 mL (10 mg/mL) | INTRAVENOUS | Status: DC | PRN
Start: 2015-03-28 — End: 2015-03-28
  Administered 2015-03-28: 20:00:00 via INTRAVENOUS

## 2015-03-28 MED ORDER — GLYCOPYRROLATE 0.2 MG/ML IJ SOLN
0.2 mg/mL | INTRAMUSCULAR | Status: AC
Start: 2015-03-28 — End: ?

## 2015-03-28 MED ORDER — ACETAMINOPHEN 1,000 MG/100 ML (10 MG/ML) IV
1000 mg/100 mL (10 mg/mL) | INTRAVENOUS | Status: AC
Start: 2015-03-28 — End: ?

## 2015-03-28 MED ORDER — BUPIVACAINE-EPINEPHRINE (PF) 0.25 %-1:200,000 IJ SOLN
0.25 %-1:200,000 | Freq: Once | INTRAMUSCULAR | Status: AC
Start: 2015-03-28 — End: 2015-03-28
  Administered 2015-03-28 (×2): via SUBCUTANEOUS

## 2015-03-28 MED ORDER — IOTHALAMATE MEGLUMINE 60 % INJECTION
60 % | INTRAMUSCULAR | Status: AC
Start: 2015-03-28 — End: ?

## 2015-03-28 MED ORDER — METRONIDAZOLE IN SODIUM CHLORIDE (ISO-OSM) 500 MG/100 ML IV PIGGY BACK
500 mg/100 mL | INTRAVENOUS | Status: DC | PRN
Start: 2015-03-28 — End: 2015-03-28
  Administered 2015-03-28: 20:00:00 via INTRAVENOUS

## 2015-03-28 MED ORDER — SODIUM CHLORIDE 0.9 % IV
10 mg/mL | INTRAVENOUS | Status: DC | PRN
Start: 2015-03-28 — End: 2015-03-28
  Administered 2015-03-28 (×6): via INTRAVENOUS

## 2015-03-28 MED ORDER — LACTATED RINGERS IV
INTRAVENOUS | Status: DC | PRN
Start: 2015-03-28 — End: 2015-03-28
  Administered 2015-03-28 (×2): via INTRAVENOUS

## 2015-03-28 MED ORDER — LIDOCAINE (PF) 20 MG/ML (2 %) IJ SOLN
20 mg/mL (2 %) | INTRAMUSCULAR | Status: AC
Start: 2015-03-28 — End: ?

## 2015-03-28 MED ORDER — GLYCOPYRROLATE 0.2 MG/ML IJ SOLN
0.2 mg/mL | INTRAMUSCULAR | Status: DC | PRN
Start: 2015-03-28 — End: 2015-03-28
  Administered 2015-03-28 (×3): via INTRAVENOUS

## 2015-03-28 MED ORDER — ROCURONIUM 10 MG/ML IV
10 mg/mL | INTRAVENOUS | Status: AC
Start: 2015-03-28 — End: ?

## 2015-03-28 MED ORDER — MIDAZOLAM 1 MG/ML IJ SOLN
1 mg/mL | INTRAMUSCULAR | Status: AC
Start: 2015-03-28 — End: ?

## 2015-03-28 MED ORDER — SUCCINYLCHOLINE CHLORIDE 20 MG/ML INJECTION
20 mg/mL | INTRAMUSCULAR | Status: DC | PRN
Start: 2015-03-28 — End: 2015-03-28
  Administered 2015-03-28: 19:00:00 via INTRAVENOUS

## 2015-03-28 MED ORDER — ONDANSETRON (PF) 4 MG/2 ML INJECTION
4 mg/2 mL | INTRAMUSCULAR | Status: DC | PRN
Start: 2015-03-28 — End: 2015-03-28
  Administered 2015-03-28: 20:00:00 via INTRAVENOUS

## 2015-03-28 MED ORDER — HYDROMORPHONE (PF) 1 MG/ML IJ SOLN
1 mg/mL | INTRAMUSCULAR | Status: AC
Start: 2015-03-28 — End: ?

## 2015-03-28 MED ORDER — HYDROMORPHONE (PF) 2 MG/ML IJ SOLN
2 mg/mL | INTRAMUSCULAR | Status: DC | PRN
Start: 2015-03-28 — End: 2015-03-28
  Administered 2015-03-28 (×5): via INTRAVENOUS

## 2015-03-28 MED ORDER — MORPHINE 10 MG/ML INJ SOLUTION
10 mg/ml | INTRAMUSCULAR | Status: DC | PRN
Start: 2015-03-28 — End: 2015-03-28

## 2015-03-28 MED ORDER — FENTANYL CITRATE (PF) 50 MCG/ML IJ SOLN
50 mcg/mL | INTRAMUSCULAR | Status: DC | PRN
Start: 2015-03-28 — End: 2015-03-28
  Administered 2015-03-28: 20:00:00 via INTRAVENOUS

## 2015-03-28 MED ORDER — PROPOFOL 10 MG/ML IV EMUL
10 mg/mL | INTRAVENOUS | Status: AC
Start: 2015-03-28 — End: ?

## 2015-03-28 MED ORDER — LIDOCAINE (PF) 20 MG/ML (2 %) IJ SOLN
20 mg/mL (2 %) | INTRAMUSCULAR | Status: DC | PRN
Start: 2015-03-28 — End: 2015-03-28
  Administered 2015-03-28: 20:00:00 via INTRAVENOUS

## 2015-03-28 MED ORDER — ONDANSETRON (PF) 4 MG/2 ML INJECTION
4 mg/2 mL | INTRAMUSCULAR | Status: AC
Start: 2015-03-28 — End: ?

## 2015-03-28 MED ORDER — DIPHENHYDRAMINE HCL 50 MG/ML IJ SOLN
50 mg/mL | Freq: Once | INTRAMUSCULAR | Status: DC | PRN
Start: 2015-03-28 — End: 2015-03-28

## 2015-03-28 MED ORDER — MIDAZOLAM 1 MG/ML IJ SOLN
1 mg/mL | INTRAMUSCULAR | Status: DC | PRN
Start: 2015-03-28 — End: 2015-03-28
  Administered 2015-03-28: 19:00:00 via INTRAVENOUS

## 2015-03-28 MED ORDER — SODIUM CHLORIDE 0.9 % IJ SYRG
INTRAMUSCULAR | Status: DC | PRN
Start: 2015-03-28 — End: 2015-03-28

## 2015-03-28 MED ORDER — NEOSTIGMINE METHYLSULFATE 1 MG/ML INJECTION
1 mg/mL | INTRAMUSCULAR | Status: DC | PRN
Start: 2015-03-28 — End: 2015-03-28
  Administered 2015-03-28: 21:00:00 via INTRAVENOUS

## 2015-03-28 MED ORDER — NEOSTIGMINE METHYLSULFATE 5 MG/5 ML (1 MG/ML) IV SYRINGE
5 mg/ mL (1 mg/mL) | INTRAVENOUS | Status: AC
Start: 2015-03-28 — End: ?

## 2015-03-28 MED ORDER — METRONIDAZOLE IN SODIUM CHLORIDE (ISO-OSM) 500 MG/100 ML IV PIGGY BACK
500 mg/100 mL | INTRAVENOUS | Status: AC
Start: 2015-03-28 — End: ?

## 2015-03-28 MED ORDER — HYDROMORPHONE (PF) 1 MG/ML IJ SOLN
1 mg/mL | INTRAMUSCULAR | Status: DC | PRN
Start: 2015-03-28 — End: 2015-03-28

## 2015-03-28 MED FILL — BUPIVACAINE-EPINEPHRINE (PF) 0.25 %-1:200,000 IJ SOLN: 0.25 %-1:200,000 | INTRAMUSCULAR | Qty: 30

## 2015-03-28 MED FILL — METRONIDAZOLE IN SODIUM CHLORIDE (ISO-OSM) 500 MG/100 ML IV PIGGY BACK: 500 mg/100 mL | INTRAVENOUS | Qty: 100

## 2015-03-28 MED FILL — LACTATED RINGERS IV: INTRAVENOUS | Qty: 1000

## 2015-03-28 MED FILL — BD POSIFLUSH NORMAL SALINE 0.9 % INJECTION SYRINGE: INTRAMUSCULAR | Qty: 10

## 2015-03-28 MED FILL — CONRAY 60 % INJECTION SOLUTION: 60 % | INTRAMUSCULAR | Qty: 50

## 2015-03-28 MED FILL — FENTANYL CITRATE (PF) 50 MCG/ML IJ SOLN: 50 mcg/mL | INTRAMUSCULAR | Qty: 2

## 2015-03-28 MED FILL — PROPOFOL 10 MG/ML IV EMUL: 10 mg/mL | INTRAVENOUS | Qty: 20

## 2015-03-28 MED FILL — MORPHINE 2 MG/ML INJECTION: 2 mg/mL | INTRAMUSCULAR | Qty: 1

## 2015-03-28 MED FILL — OFIRMEV 1,000 MG/100 ML (10 MG/ML) INTRAVENOUS SOLUTION: 1000 mg/100 mL (10 mg/mL) | INTRAVENOUS | Qty: 100

## 2015-03-28 MED FILL — HYDROMORPHONE (PF) 1 MG/ML IJ SOLN: 1 mg/mL | INTRAMUSCULAR | Qty: 1

## 2015-03-28 MED FILL — QUELICIN 20 MG/ML INJECTION SOLUTION: 20 mg/mL | INTRAMUSCULAR | Qty: 10

## 2015-03-28 MED FILL — ONDANSETRON (PF) 4 MG/2 ML INJECTION: 4 mg/2 mL | INTRAMUSCULAR | Qty: 2

## 2015-03-28 MED FILL — ROCURONIUM 10 MG/ML IV: 10 mg/mL | INTRAVENOUS | Qty: 5

## 2015-03-28 MED FILL — LIDOCAINE (PF) 20 MG/ML (2 %) IJ SOLN: 20 mg/mL (2 %) | INTRAMUSCULAR | Qty: 5

## 2015-03-28 MED FILL — NEOSTIGMINE METHYLSULFATE 5 MG/5 ML (1 MG/ML) IV SYRINGE: 5 mg/ mL (1 mg/mL) | INTRAVENOUS | Qty: 5

## 2015-03-28 MED FILL — GLYCOPYRROLATE 0.2 MG/ML IJ SOLN: 0.2 mg/mL | INTRAMUSCULAR | Qty: 2

## 2015-03-28 MED FILL — MIDAZOLAM 1 MG/ML IJ SOLN: 1 mg/mL | INTRAMUSCULAR | Qty: 2

## 2015-03-28 MED FILL — D5-NS WITH POTASSIUM CHLORIDE 20 MEQ/L IV: 20 mEq/L | INTRAVENOUS | Qty: 1000

## 2015-03-28 MED FILL — SODIUM CHLORIDE 0.9 % IV PIGGY BACK: INTRAVENOUS | Qty: 50

## 2015-03-28 NOTE — Other (Signed)
1440- pt unable to void at this time.  Voided early upon arrival to unit

## 2015-03-28 NOTE — Progress Notes (Signed)
GI Progress Note [facility])  NAME:Diane Nelson DOB:Jun 28, 1986 ZOX:[id number]   ATTG: Dr. Oletha CruelGrillon  PCP: None  Date/Time:  03/28/2015  Assessment:   ?? Acute cholecystitis s/p cholecystectomy this afternoon  ?? Suspected CBD stone/biliary obx on IOC  ?? Mild developmental delay     Plan:   ?? Will add on for ERCP tomorrow with Dr. Sandie Anouckworth  ?? Likely to be done about noon vs late afternoon  ?? NPO p MN  ?? Continue abx  ?? Previously reviewed risks, benefits, alternatives with family, will re-discuss tomorrow     Subjective:   Called back for positive IOC, possible choledocholithiasis at level of ampulla.    Patient still in the OR/PACU.     Objective:   VITALS:   Last 24hrs VS reviewed since prior progress note. Most recent are:  Visit Vitals   Item Reading   ??? BP 111/67 mmHg   ??? Pulse 66   ??? Temp 97.8 ??F (36.6 ??C)   ??? Resp 16   ??? Ht 5\' 6"  (1.676 m)   ??? Wt 81.194 kg (179 lb)   ??? BMI 28.91 kg/m2   ??? SpO2 95%       Intake/Output Summary (Last 24 hours) at 03/28/15 1706  Last data filed at 03/28/15 1634   Gross per 24 hour   Intake   1000 ml   Output      0 ml   Net   1000 ml     PHYSICAL EXAM:  Not examined today    Lab and Radiology Data Reviewed: (see below)    Medications Reviewed: (see below)  PMH/SH reviewed - no change compared to H&P  ________________________________________________________________________  Total time spent with patient: 10 minutes ________________________________________________________________________  Care Plan discussed with:  Patient    Family     RN               Surgery:  x     Sandy SalaamAlex R Caidon Foti, MD     Procedures: see electronic medical records for all procedures/Xrays and details which were not copied into this note but were reviewed prior to creation of Plan.      LABS:  Recent Labs      03/28/15   0403  03/27/15   0526   WBC  5.0  6.8   HGB  12.2  12.5   HCT  36.5  45.5   PLT  279  278     Recent Labs      03/28/15   0403  03/27/15   0526  03/26/15   1754   NA  140  143  138   K  3.8  4.3  4.8    CL  106  109*  102   CO2  26  30  30    BUN  5*  8  12   CREA  0.68  0.78  1.01   GLU  87  69  99   CA  8.3*  8.3*  9.2   MG  2.0   --   2.4     Recent Labs      03/28/15   0403  03/27/15   0526  03/26/15   1754   SGOT  175*  336*  361*   AP  194*  212*  220*   TP  6.8  7.2  8.1   ALB  3.2*  3.4*  4.0   GLOB  3.6  3.8  4.1*   LPSE  85   --   120     No results for input(s): INR, PTP, APTT in the last 72 hours.    Invalid input(s): INREXT, INREXT   No results for input(s): FE, TIBC, PSAT, FERR in the last 72 hours.   Lab Results   Component Value Date/Time    FOLATE 29.6 03/28/2015 04:03 AM     No results for input(s): PH, PCO2, PO2 in the last 72 hours.  No results for input(s): CPK, CKMB in the last 72 hours.    Invalid input(s): TROPONINI  Lab Results   Component Value Date/Time    COLOR AMBER 03/26/2015 06:27 PM    APPEARANCE CLOUDY 03/26/2015 06:27 PM    SPECIFIC GRAVITY 1.030 03/26/2015 06:27 PM    PH (UA) 6.0 03/26/2015 06:27 PM    PROTEIN NEGATIVE  03/26/2015 06:27 PM    GLUCOSE NEGATIVE  03/26/2015 06:27 PM    KETONE TRACE 03/26/2015 06:27 PM    UROBILINOGEN 1.0 03/26/2015 06:27 PM    NITRITES POSITIVE 03/26/2015 06:27 PM    LEUKOCYTE ESTERASE SMALL 03/26/2015 06:27 PM    EPITHELIAL CELLS FEW 03/26/2015 06:27 PM    BACTERIA 1+ 03/26/2015 06:27 PM    WBC 5-10 03/26/2015 06:27 PM    RBC 0-5 03/26/2015 06:27 PM       MEDICATIONS:  Current Facility-Administered Medications   Medication Dose Route Frequency   ??? lactated ringers infusion  25 mL/hr IntraVENous CONTINUOUS   ??? sodium chloride (NS) flush 5-10 mL  5-10 mL IntraVENous Q8H   ??? sodium chloride (NS) flush 5-10 mL  5-10 mL IntraVENous PRN   ??? metroNIDAZOLE (FLAGYL) IVPB premix 500 mg  500 mg IntraVENous Q8H   ??? cefTRIAXone (ROCEPHIN) 1 g in 0.9% sodium chloride (MBP/ADV) 50 mL  1 g IntraVENous Q24H   ??? morphine injection 1 mg  1 mg IntraVENous Q6H PRN   ??? polyethylene glycol (MIRALAX) packet 17 g  17 g Oral DAILY    ??? dextrose 5% and 0.9% NaCl infusion  100 mL/hr IntraVENous CONTINUOUS     Facility-Administered Medications Ordered in Other Encounters   Medication Dose Route Frequency   ??? HYDROmorphone (PF) (DILAUDID) injection    PRN   ??? midazolam (VERSED) injection   IntraVENous PRN   ??? ondansetron (ZOFRAN) injection    PRN   ??? rocuronium (ZEMURON) injection   IntraVENous PRN   ??? propofol (DIPRIVAN) 10 mg/mL injection   IntraVENous PRN   ??? succinylcholine (ANECTINE) injection   IntraVENous PRN   ??? lactated ringers infusion   IntraVENous CONTINUOUS   ??? glycopyrrolate (ROBINUL) injection   IntraVENous PRN   ??? fentaNYL citrate (PF) injection    PRN   ??? lidocaine (PF) (XYLOCAINE) 20 mg/mL (2 %) injection   IntraVENous PRN   ??? metroNIDAZOLE (FLAGYL) IVPB premix   IntraVENous PRN   ??? PHENYLephrine (NEOSYNEPHRINE) 10 mg in 0.9% sodium chloride 250 mL infusion  10 mg IntraVENous CONTINUOUS   ??? acetaminophen (OFIRMEV) infusion   IntraVENous PRN   ??? neostigmine (PROSTIGMINE) injection   IntraVENous PRN

## 2015-03-28 NOTE — Progress Notes (Signed)
Dr. Allena KatzPatel called, stated he was on his way to assess patient. The on call surgeon called also. Has received Morphine 1 mg IV at 1800.   2051 Dr. Cora DanielsNagamagala here and orders received for pain medication.

## 2015-03-28 NOTE — Progress Notes (Signed)
Upon assessing patient she start to c/o chest pain 8/10. Rapid responsive was called, lab work was drawn and patient was given morphine 1 mg IV for chest pain with good results. Abdominal bedside Xray was order and stat EKG. Patient remain stable with sister @ bedside.

## 2015-03-28 NOTE — Anesthesia Post-Procedure Evaluation (Signed)
Post-Anesthesia Evaluation and Assessment    Patient: Diane Nelson MRN: [id number]  SSN: [id number]    Date of Birth: 04/06/1986  Age: 29 y.o.  Sex: female       Cardiovascular Function/Vital Signs  Visit Vitals   Item Reading   ??? BP 117/54 mmHg   ??? Pulse 66   ??? Temp 37 ??C (98.6 ??F)   ??? Resp 20   ??? Ht 5\' 6"  (1.676 m)   ??? Wt 81.194 kg (179 lb)   ??? BMI 28.91 kg/m2   ??? SpO2 98%       Patient is status post general anesthesia for Procedure(s):  CHOLECYSTECTOMY LAPAROSCOPIC WITH GRAMS.    Nausea/Vomiting: None    Postoperative hydration reviewed and adequate.    Pain:  Pain Scale 1: FLACC (03/28/15 1715)  Pain Intensity 1: 0 (03/28/15 1345)   Managed    Neurological Status:   Neuro (WDL): Within Defined Limits (03/28/15 1707)  Neuro  Neurologic State: Alert (03/28/15 1707)  Orientation Level: Oriented X4 (03/28/15 1707)  Cognition: Follows commands (03/28/15 1707)  Speech: Clear (03/28/15 1707)  LUE Motor Response: Purposeful (03/28/15 1707)  LLE Motor Response: Purposeful (03/28/15 1707)  RUE Motor Response: Purposeful (03/28/15 1707)  RLE Motor Response: Purposeful (03/28/15 1707)   At baseline    Mental Status and Level of Consciousness: Alert and oriented     Pulmonary Status:   O2 Device: Nasal cannula (03/28/15 1725)   Adequate oxygenation and airway patent    Complications related to anesthesia: None    Post-anesthesia assessment completed. No concerns    Signed By: Jonnie KindJ KEITH Brantley Wiley, MD     March 28, 2015

## 2015-03-28 NOTE — Progress Notes (Signed)
Change of shift report received from Lisa, RN in SBAR format.

## 2015-03-28 NOTE — Progress Notes (Signed)
Called to bedside as RRT called. Pt c/o sudden onset CP 9/10 radiating to her belly. Pt underwent laparoscopic cholecystectomy this evening for CBD obstruction and had been on morphine for pain control.  At bedside, still with severe pain in her chest, not radiating, no N/V, some SOB.  O/E: Awake alert in mild distress,  CVS; s1 s2 regular, chest wall tenderness +, epigastric tenderness  RS good BS b/l.  Abd: surgical scar over the abd wall, BS hypoactive, diffuse tenderness.   HR: 57   BP; 112/64. O2: 100% on 2L NC   A/P: Chest pain, atypical.possibly related to her recent surgery/GERD/? bleed  EKG no acute changes, place on telemetry.  Stat Abd Xray. Chk BMP, H/H, troponin.  IVF.  Morphine 1 mg IV once.  IV protonix.   Dr Oletha CruelGrillon has been informed and will be coming in to see the patient.    Total critical care 35 minutes.

## 2015-03-28 NOTE — Progress Notes (Signed)
Checked patients heart rate while walking and was between 65-80 oxygen was normal

## 2015-03-28 NOTE — Other (Signed)
TRANSFER - OUT REPORT:    Verbal report given to D. COOK, RN (name) on Diane Nelson  being transferred to [facility]  (unit) for routine post - op       Report consisted of patient???s Situation, Background, Assessment and   Recommendations(SBAR).     Information from the following report(s) SBAR and OR Summary was reviewed with the receiving nurse.    Opportunity for questions and clarification was provided.      Patient transported with:   O2 @ 2 liters  Registered Nurse

## 2015-03-28 NOTE — Anesthesia Pre-Procedure Evaluation (Signed)
Anesthetic History   No history of anesthetic complications            Review of Systems / Medical History  Patient summary reviewed, nursing notes reviewed and pertinent labs reviewed    Pulmonary  Within defined limits                 Neuro/Psych   Within defined limits           Cardiovascular  Within defined limits                Exercise tolerance: >4 METS     GI/Hepatic/Renal  Within defined limits              Endo/Other  Within defined limits           Other Findings              Physical Exam    Airway  Mallampati: II  TM Distance: 4 - 6 cm  Neck ROM: normal range of motion   Mouth opening: Normal     Cardiovascular  Regular rate and rhythm,  S1 and S2 normal,  no murmur, click, rub, or gallop             Dental  No notable dental hx       Pulmonary  Breath sounds clear to auscultation               Abdominal  GI exam deferred       Other Findings            Anesthetic Plan    ASA: 1  Anesthesia type: general    Monitoring Plan: BIS      Induction: Intravenous  Anesthetic plan and risks discussed with: Patient

## 2015-03-28 NOTE — Op Note (Signed)
Name:      Diane Nelson, Diane Nelson                                          Surgeon:        Diane JubileeMichael S Ryland Tungate, MD  Account #: [id number]                 Surgery Date:   03/28/2015  DOB:       1986-06-05  Age:       [age]                           Location:                                 OPERATIVE REPORT            PREOPERATIVE DIAGNOSIS: Cholelithiasis, question common bile duct  obstruction.    POSTOPERATIVE DIAGNOSIS: Cholelithiasis, question common bile duct  obstruction with common bile duct obstruction.    PROCEDURES PERFORMED: Laparoscopic cholecystectomy with intraoperative  cholangiogram.    SURGEON: Diane JubileeMichael S Sanii Kukla, MD    ANESTHESIA: General endotracheal tube.    ESTIMATED BLOOD LOSS: Minimal.    SPECIMENS REMOVED: Gallbladder.    COMPLICATIONS: None.    IMPLANTS: None.    DRAINS: JP drain is left in the gallbladder fossa region.    INDICATIONS FOR PROCEDURE: This is a 29 year old white female who presented  with what appeared to be cholelithiasis with a common bile duct obstruction  with elevated liver function tests. She had an MRCP which showed no  evidence of obstruction, and her liver function tests were coming down, and  she comes to surgery for laparoscopic cholecystectomy and intraoperative  cholangiogram.    FINDINGS: At the time of surgery, the patient is found to have a distended  gallbladder, has adhesions to the anterior aspect of the gallbladder. She  was found to have a large cystic duct and prominent common bile duct. An  intraoperative cholangiogram is performed and it shows no filling of the  duodenum.  The obstruction appears to be at the level of the ampulla, and  there is a question if the outline of the stone can be identified, but she  clearly has obstruction.    Dr. Christy Nelson the gastroenterologist is made aware of this and will make  arrangements for ERCP tomorrow.    DESCRIPTION OF PROCEDURE: With the patient in the supine position under   adequate general anesthesia, the abdomen is prepped and draped in the usual  fashion. After an appropriate time-out, Marcaine with epinephrine is  injected just below the level of the umbilicus. Incision is made at this  level and carried down to the underlying midline fascia.    A pursestring suture was placed in the fascia, and within this pursestring  an incision is made, and the abdomen is entered under direct visualization.  Hasson trocar is placed at this level and CO2 was insufflated into the  abdominal cavity. The bed is positioned with the head up and turned towards  the left to allow gravity to assist with exposure. The patient has an NG  tube, which is used to decompress the stomach to some extent.  At this  point in time, 3 additional ports are placed in the upper  abdomen. These  being 5 mm ports.  Each placed after injection of local anesthetic and a 5  mm incision.    This allows introduction of instruments into the abdominal cavity and the  gallbladder is grasped and retracted cephalad and laterally. It is found to  have multiple adhesions to its anterior aspect. These were taken down by  using electrocautery and both sharp and blunt dissection to take down the  adhesions. At this point in time, dissection is then performed at the base  of the gallbladder where the cystic duct is dissected out. It is quite  large in caliber and as the dissection is performed, the cystic artery is  also identified.  The artery is clipped and transected. The common bile  duct also is prominent in its course, easily identified.  At this point in  time we continued to dissect out the cystic duct further and then placed a  clip on the duct towards the gallbladder. Scissors are used to open the  cystic duct and a cholangiocatheter is then admitted into this site. The  balloon is blown up and the instrument which it was introduced with is a  clamping instrument which is also used to secure the catheter in place.   Saline is injected and there is a good seal.  At this point in time we then  reposition the bed flat, removing the majority of instruments and obtained  an intraoperative cholangiogram using half-strength contrast.    The patient is found to have flow into the common bile duct, as well as  into the intrahepatic valve, which appeared to be dilated. There was no  flow into the duodenum, and it appears that the obstruction is at the level  of the ampulla.  Question as to whether we can actually identify a stone at  this level is questionable, but there is clearly an obstruction identified.  At this point in time, we return the table to its previous position and  instruments are reintroduced into the abdominal cavity. The clamp on the  cystic duct is removed. The wound is deflated and the cholangiocatheter is  removed. At this point in time, we put several clips on the cystic duct.  It is quite large and we cut through half of the cystic duct and then put  additional clips posteriorly. At this point in time, we completely  transected the cystic duct just off of the gallbladder, and we were able to  dissect the gallbladder out of the liver bed using electrocautery. The  gallbladder is then placed into an Endobag and delivered from the abdominal  cavity and sent to Pathology.    We returned the Hasson and irrigation and suctioning is performed. The  clips on the cystic duct are in place, but due to its size and the  anticipated ERCP in the next 24 hours, a drain is placed in the gallbladder  fossa. This was brought out through our most lateral port on the abdomen  and sutured at the level of the skin and applied to bulb suction.  Irrigation and suctioning is clear, and at this point in time we removed  the ports under direct visualization.    The majority of the pneumoperitoneum is evacuated, and the final port  removed is that at the infraumbilical site. The fascial defect here is   closed using the pursestring suture. Skin incisions are then closed using  subcuticular closure covered by Dermabond. The drain had been applied to  a  bulb suction and the patient is transferred to the recovery area in stable  condition having tolerated the procedure with needle, sponge and instrument  counts being reported as correct.  A call was made to Dr. Christy Gentles to make  him aware that the patient has an obstruction and he will arrange ERCP for  tomorrow.          Diane Jubilee, MD    cc:   Diane Jubilee, MD        MSG/wmx; D: 03/28/2015 04:54 P; T: 03/28/2015 09:42 P; Doc# [id number]; Job#  [id number]

## 2015-03-28 NOTE — Progress Notes (Signed)
Surgery      Hospitalist evaluation during Rapid response greatly appreaciated.    Appears to most likely be postop pain, although some lab work still pending.    H/H is 12.9/38.7, essentially unchanged from preop, and JP drain has min serosang output.    Will adjust pain med regimen and cont to watch closely.    Patient has stable vital signs and is comfortable at the moment.      Kelton PillarMichael S. Oletha CruelGrillon MD, [facility] HospitalFACS  [facility]

## 2015-03-28 NOTE — Brief Op Note (Signed)
BRIEF OPERATIVE NOTE    Date of Procedure: 03/28/2015   Preoperative Diagnosis:cholelithiasis,? CBD Obstruction  Postoperative Diagnosis:same, CBD Obstruction    Procedure(s):  CHOLECYSTECTOMY LAPAROSCOPIC WITH GRAMS  Surgeon(s) and Role:     * Ella JubileeMichael S Geordan Xu, MD - Primary  Circ-1: Edythe ClarityJoyce Jones V, RN  Scrub Tech-1: Amy Hunter  Surg Asst-1: Granville Lewisonald R Liggitt  Event Time In   Incision Start 1549   Incision Close      Anesthesia: General   Estimated Blood Loss:min  Specimens:   ID Type Source Tests Collected by Time Destination   1 : gallbladder Preservative Gallbladder  Ella JubileeMichael S Danylle Ouk, MD 03/28/2015 1607 Pathology      Findings: adhesions to GB, intraop cholangiogram shows obstruction at ampulla, no flow into Duodenum, large cystic duct  Complicationsnone  Implants: * No implants in log *  JP drain    Kelton PillarMichael S. Oletha CruelGrillon, MD,FACS    [id number]

## 2015-03-28 NOTE — Progress Notes (Signed)
GI Progress Note Glee Arvin)  NAME:Diane Nelson DOB:1986-04-20 OZH:[id number]   ATTG: Dr. Verdell Face  PCP: None  Date/Time:  03/28/2015 8:19 AM   Assessment:   ?? Acute cholecystitis  ?? Likely passed CBD stone  ?? No cholelithiasis on MRCP  ?? Mild developmental delay     Plan:   ?? Likely to surgery this week, possibly today  ?? No need for ERCP at this time  ?? Will sign off, please re-recontact GI if concern for further biliary obstruction, need for ERCP, or any other questions     Subjective:   Feels well today. Bili down again.   Reviewed likely surgery plans, and results of preliminary MRCP (now finalized)    MRCP IMPRESSION: "Cholelithiasis with small pericholecystic fluid concerning   for cholecystitis. No evidence of choledocholithiasis. Essentially normal exam otherwise"    Objective:   VITALS:   Last 24hrs VS reviewed since prior progress note. Most recent are:  Visit Vitals   Item Reading   ??? BP 103/63 mmHg   ??? Pulse 75   ??? Temp 98.6 ??F (37 ??C)   ??? Resp 18   ??? Ht  (1.676 m)   ??? Wt 81.5 kg (179 lb 10.8 oz)   ??? BMI 29.01 kg/m2   ??? SpO2 92%     No intake or output data in the 24 hours ending 03/28/15 0819  PHYSICAL EXAM:  General: WD, WN. Alert, cooperative, no acute distress????  HEENT: NC, Atraumatic. Anicteric sclerae.  Lungs:  CTA Bilaterally. No Wheezing/Rhonchi/Rales.  Heart:  Regular  rhythm,?? No murmur (), No Rubs, No Gallops  Abdomen: Soft, Non distended, Non tender. ??+Bowel sounds, no HSM  Extremities: No c/c/e  Neurologic:?? Alert and oriented X 3.  No acute neurological distress   Psych:???? Not anxious nor agitated.    Lab and Radiology Data Reviewed: (see below)    Medications Reviewed: (see below)  PMH/SH reviewed - no change compared to H&P  ________________________________________________________________________  Total time spent with patient: 10 minutes ________________________________________________________________________  Care Plan discussed with:  Patient x   Family     RN                Consultant:       Sandy Salaam, MD     Procedures: see electronic medical records for all procedures/Xrays and details which were not copied into this note but were reviewed prior to creation of Plan.      LABS:  Recent Labs      03/28/15   0403  03/27/15   0526   WBC  5.0  6.8   HGB  12.2  12.5   HCT  36.5  45.5   PLT  279  278     Recent Labs      03/28/15   0403  03/27/15   0526  03/26/15   1754   NA  140  143  138   K  3.8  4.3  4.8   CL  106  109*  102   CO2  BUN  5*  8  12   CREA  0.68  0.78  1.01   GLU  87  69  99   CA  8.3*  8.3*  9.2   MG  2.0   --   2.4     Recent Labs      03/28/15   0403  03/27/15   0526  03/26/15  1754   SGOT  175*  336*  361*   AP  194*  212*  220*   TP  6.8  7.2  8.1   ALB  3.2*  3.4*  4.0   GLOB  3.6  3.8  4.1*   LPSE  85   --   120     No results for input(s): INR, PTP, APTT in the last 72 hours.    Invalid input(s): INREXT   No results for input(s): FE, TIBC, PSAT, FERR in the last 72 hours.   Lab Results   Component Value Date/Time    FOLATE 29.6 03/28/2015 04:03 AM     No results for input(s): PH, PCO2, PO2 in the last 72 hours.  No results for input(s): CPK, CKMB in the last 72 hours.    Invalid input(s): TROPONINI  Lab Results   Component Value Date/Time    COLOR AMBER 03/26/2015 06:27 PM    APPEARANCE CLOUDY 03/26/2015 06:27 PM    SPECIFIC GRAVITY 1.030 03/26/2015 06:27 PM    PH (UA) 6.0 03/26/2015 06:27 PM    PROTEIN NEGATIVE  03/26/2015 06:27 PM    GLUCOSE NEGATIVE  03/26/2015 06:27 PM    KETONE TRACE 03/26/2015 06:27 PM    UROBILINOGEN 1.0 03/26/2015 06:27 PM    NITRITES POSITIVE 03/26/2015 06:27 PM    LEUKOCYTE ESTERASE SMALL 03/26/2015 06:27 PM    EPITHELIAL CELLS FEW 03/26/2015 06:27 PM    BACTERIA 1+ 03/26/2015 06:27 PM    WBC 5-10 03/26/2015 06:27 PM    RBC 0-5 03/26/2015 06:27 PM       MEDICATIONS:  Current Facility-Administered Medications   Medication Dose Route Frequency   ??? sodium chloride (NS) flush 5-10 mL  5-10 mL IntraVENous Q8H    ??? sodium chloride (NS) flush 5-10 mL  5-10 mL IntraVENous PRN   ??? metroNIDAZOLE (FLAGYL) IVPB premix 500 mg  500 mg IntraVENous Q8H   ??? cefTRIAXone (ROCEPHIN) 1 g in 0.9% sodium chloride (MBP/ADV) 50 mL  1 g IntraVENous Q24H   ??? morphine injection 1 mg  1 mg IntraVENous Q6H PRN   ??? polyethylene glycol (MIRALAX) packet 17 g  17 g Oral DAILY   ??? dextrose 5% and 0.9% NaCl infusion  100 mL/hr IntraVENous CONTINUOUS

## 2015-03-28 NOTE — Op Note (Signed)
Name:      Diane Nelson, Diane Nelson                                        [place]  Surgeon:        Ella JubileeMichael S [name], MD  Account #: [id number]                 Surgery Date:   03/28/2015  DOB:       1986/05/22  Age:       29                           Location:                                 OPERATIVE REPORT            PREOPERATIVE DIAGNOSIS: Cholelithiasis, question common bile duct  obstruction.    POSTOPERATIVE DIAGNOSIS: Cholelithiasis, question common bile duct  obstruction with common bile duct obstruction.    PROCEDURES PERFORMED: Laparoscopic cholecystectomy with intraoperative  cholangiogram.    SURGEON: Ella JubileeMichael S Diane Blakney, MD    ANESTHESIA: General endotracheal tube.    ESTIMATED BLOOD LOSS: Minimal.    SPECIMENS REMOVED: Gallbladder.    COMPLICATIONS: None.    IMPLANTS: None.    DRAINS: JP drain is left in the gallbladder fossa region.    INDICATIONS FOR PROCEDURE: This is a 29 year old white female who presented  with what appeared to be cholelithiasis with a common bile duct obstruction  with elevated liver function tests. She had an MRCP which showed no  evidence of obstruction, and her liver function tests were coming down, and  she comes to surgery for laparoscopic cholecystectomy and intraoperative  cholangiogram.    FINDINGS: At the time of surgery, the patient is found to have a distended  gallbladder, has adhesions to the anterior aspect of the gallbladder. She  was found to have a large cystic duct and prominent common bile duct. An  intraoperative cholangiogram is performed and it shows no filling of the  duodenum.  The obstruction appears to be at the level of the ampulla, and  there is a question if the outline of the stone can be identified, but she  clearly has obstruction.    Dr. Christy GentlesSeeman the gastroenterologist is made aware of this and will make  arrangements for ERCP tomorrow.    DESCRIPTION OF PROCEDURE: With the patient in the supine position under  adequate general anesthesia, the abdomen is prepped  and draped in the usual  fashion. After an appropriate time-out, Marcaine with epinephrine is  injected just below the level of the umbilicus. Incision is made at this  level and carried down to the underlying midline fascia.    A pursestring suture was placed in the fascia, and within this pursestring  an incision is made, and the abdomen is entered under direct visualization.  Hasson trocar is placed at this level and CO2 was insufflated into the  abdominal cavity. The bed is positioned with the head up and turned towards  the left to allow gravity to assist with exposure. The patient has an NG  tube, which is used to decompress the stomach to some extent.  At this  point in time, 3 additional ports are placed in the upper  abdomen. These  being 5 mm ports.  Each placed after injection of local anesthetic and a 5  mm incision.    This allows introduction of instruments into the abdominal cavity and the  gallbladder is grasped and retracted cephalad and laterally. It is found to  have multiple adhesions to its anterior aspect. These were taken down by  using electrocautery and both sharp and blunt dissection to take down the  adhesions. At this point in time, dissection is then performed at the base  of the gallbladder where the cystic duct is dissected out. It is quite  large in caliber and as the dissection is performed, the cystic artery is  also identified.  The artery is clipped and transected. The common bile  duct also is prominent in its course, easily identified.  At this point in  time we continued to dissect out the cystic duct further and then placed a  clip on the duct towards the gallbladder. Scissors are used to open the  cystic duct and a cholangiocatheter is then admitted into this site. The  balloon is blown up and the instrument which it was introduced with is a  clamping instrument which is also used to secure the catheter in place.  Saline is injected and there is a good seal.  At this point in  time we then  reposition the bed flat, removing the majority of instruments and obtained  an intraoperative cholangiogram using half-strength contrast.    The patient is found to have flow into the common bile duct, as well as  into the intrahepatic valve, which appeared to be dilated. There was no  flow into the duodenum, and it appears that the obstruction is at the level  of the ampulla.  Question as to whether we can actually identify a stone at  this level is questionable, but there is clearly an obstruction identified.  At this point in time, we return the table to its previous position and  instruments are reintroduced into the abdominal cavity. The clamp on the  cystic duct is removed. The wound is deflated and the cholangiocatheter is  removed. At this point in time, we put several clips on the cystic duct.  It is quite large and we cut through half of the cystic duct and then put  additional clips posteriorly. At this point in time, we completely  transected the cystic duct just off of the gallbladder, and we were able to  dissect the gallbladder out of the liver bed using electrocautery. The  gallbladder is then placed into an Endobag and delivered from the abdominal  cavity and sent to Pathology.    We returned the Hasson and irrigation and suctioning is performed. The  clips on the cystic duct are in place, but due to its size and the  anticipated ERCP in the next 24 hours, a drain is placed in the gallbladder  fossa. This was brought out through our most lateral port on the abdomen  and sutured at the level of the skin and applied to bulb suction.  Irrigation and suctioning is clear, and at this point in time we removed  the ports under direct visualization.    The majority of the pneumoperitoneum is evacuated, and the final port  removed is that at the infraumbilical site. The fascial defect here is  closed using the pursestring suture. Skin incisions are then closed using  subcuticular closure  covered by Dermabond. The drain had been applied to  a  bulb suction and the patient is transferred to the recovery area in stable  condition having tolerated the procedure with needle, sponge and instrument  counts being reported as correct.  A call was made to Dr. Christy Gentles to make  him aware that the patient has an obstruction and he will arrange ERCP for  tomorrow.          Ella Jubilee, MD    cc:   Ella Jubilee, MD        MSG/wmx; D: 03/28/2015 04:54 P; T: 03/28/2015 09:42 P; Doc# [id number]; Job#  [id number]

## 2015-03-28 NOTE — Progress Notes (Addendum)
Hospitalist Progress Note    NAME: Diane [name]   DOB:  [birth year]   MRN:  [id number]       Interim Hospital Summary: 29 y.o. female whom presented on 03/26/2015 with abd pain      Assessment / Plan:  Likely passed CBD stone leading to acute cholecystitis   Cholelithiasis  Elevated LFT's   Diet: NPO  IVF  Pain meds: effective   GI help appreciated: MRCP negative  Gen suregry help was appreciated: OR today for lap chole   Cont empiric AB: CTX + flagyl     UTI, POA   UC mixed florae, CTX will cover for now ( 3 days will be enough)     Macrocytosis  TSH low, check free T4   B12/folate normal   Addendum: free T4 normal;likely sick thyroid due to acute illness; follow with PCP     Sinus bradycardia  At 50th at rest, accelerating with exertion, asymptomatic  No need for further w/u  Dc tele     Mild developmental delay     Code status: Full   Surrogate Decision Maker: sister  Prophylaxis: SCD's and a/coag on hold pending surgery ; ambulate in hall TID    Baseline: lives with sister; on disability for slow learning   Recommended Disposition: Home w/Family     D/w Dr Micah NoelGrillion today, greatly appreciating his help taking pt on his service. Little to add from our standpoint at this time; DC planning was d/w SW today ( PCP); will sign off for now; please, do not hesitate to call us with questions or new issues      Subjective:     Chief Complaint / Reason for Physician Visit: following cholecystitis/ uti  Feeling better     Discussed with RN events overnight.     Review of Systems:  Symptom Y/N Comments  Symptom Y/N Comments   Fever/Chills n   Chest Pain n    Poor Appetite    Edema     Cough    Abdominal Pain y    Sputum    Joint Pain     SOB/DOE n   Pruritis/Rash     Nausea/vomit n   Tolerating PT/OT  Independent    Diarrhea n   Tolerating Diet  NPO   Constipation n   Other       Could NOT obtain due to:      Objective:     VITALS:    Last 24hrs VS reviewed since prior progress note. Most recent are:  Patient Vitals for the past 24 hrs:   Temp Pulse Resp BP SpO2   03/28/15 1123 98 ??F (36.7 ??C) 77 18 107/65 mmHg 94 %   03/28/15 0724 98.6 ??F (37 ??C) 75 18 103/63 mmHg 92 %   03/28/15 0358 98.1 ??F (36.7 ??C) 72 18 103/58 mmHg 99 %   03/27/15 2318 97.8 ??F (36.6 ??C) 80 18 106/59 mmHg 94 %   03/27/15 1958 97.8 ??F (36.6 ??C) 67 16 106/61 mmHg 97 %   03/27/15 1700 97.7 ??F (36.5 ??C) 69 16 97/74 mmHg 97 %   03/27/15 1138 98.3 ??F (36.8 ??C) 64 16 102/56 mmHg 95 %     No intake or output data in the 24 hours ending 03/28/15 1125     PHYSICAL EXAM:  General: WD, WN. Alert, cooperative, no acute distress????  EENT:  EOMI. Anicteric sclerae. MMM  Resp:  CTA bilaterally, no wheezing or rales.  No accessory muscle  use  CV:  Regular  rhythm,?? No edema  GI:  Soft, Non distended, + mild RUQ tender, no guardian. ??+Bowel sounds  Neurologic:?? Alert and oriented X 3, normal speech,   Psych:???? Good insight.??Not anxious nor agitated  Skin:  No rashes.  No jaundice    Reviewed most current lab test results and cultures  YES  Reviewed most current radiology test results   YES  Review and summation of old records today    NO  Reviewed patient's current orders and MAR    YES  PMH/SH reviewed - no change compared to H&P  ________________________________________________________________________  Care Plan discussed with:    Comments   Patient y    Family      RN y    Buyer, retail  y GI                      Multidiciplinary team rounds were held today with case manager, nursing, pharmacist and Higher education careers adviser.  Patient's plan of care was discussed; medications were reviewed and discharge planning was addressed.     ________________________________________________________________________  Total NON critical care TIME:  35 Minutes    Total CRITICAL CARE TIME Spent:   Minutes non procedure based      Comments    >50% of visit spent in counseling and coordination of care     ________________________________________________________________________  Guerry Bruin, MD     Procedures: see electronic medical records for all procedures/Xrays and details which were not copied into this note but were reviewed prior to creation of Plan.      LABS:  I reviewed today's most current labs and imaging studies.  Pertinent labs include:  Recent Labs      03/28/15   0403  03/27/15   0526  03/26/15   1754   WBC  5.0  6.8  7.8   HGB  12.2  12.5  13.6   HCT  36.5  45.5  41.9   PLT  279  278  344     Recent Labs      03/28/15   0403  03/27/15   0526  03/26/15   1754   NA  140  143  138   K  3.8  4.3  4.8   CL  106  109*  102   CO2  GLU  87  69  99   BUN  5*  8  12   CREA  0.68  0.78  1.01   CA  8.3*  8.3*  9.2   MG  2.0   --   2.4   ALB  3.2*  3.4*  4.0   TBILI  1.8*  4.4*  5.4*   SGOT  175*  336*  361*   ALT  368*  466*  502*

## 2015-03-28 NOTE — Other (Signed)
sbar in note received pt in holding area identifies self .   Reports has not eatened any food or drink since Tuesday.   vss voided in bathroom at 1340.  Consent to be filled out in holding area  When orders received.

## 2015-03-28 NOTE — Progress Notes (Signed)
Bedside and Verbal shift change report given to Dana, RN (oncoming nurse) by Stephanie RN (offgoing nurse).  Report given with SBAR, Kardex, Intake/Output, MAR and Recent Results.

## 2015-03-28 NOTE — Other (Signed)
Handoff Report from Operating Room to PACU    Report received from Brooke DareJ. JONES, RN and Denna HaggardW. MURPHY, CRNA regarding Diane Nelson.      Surgeon(s):  Ella JubileeMichael S Grillon, MD  And Procedure(s) (LRB):  CHOLECYSTECTOMY LAPAROSCOPIC WITH GRAMS (N/A)  confirmed   with allergies, drains and dressings discussed.    Anesthesia type, drugs, patient history, complications, estimated blood loss, vital signs, intake and output, and last pain medication, lines and temperature were reviewed.

## 2015-03-29 LAB — METABOLIC PANEL, BASIC
Anion gap: 11 mmol/L (ref 5–15)
BUN/Creatinine ratio: 9 — ABNORMAL LOW (ref 12–20)
BUN: 7 MG/DL (ref 6–20)
CO2: 25 mmol/L (ref 21–32)
Calcium: 8.9 MG/DL (ref 8.5–10.1)
Chloride: 102 mmol/L (ref 97–108)
Creatinine: 0.77 MG/DL (ref 0.55–1.02)
GFR est AA: >60 mL/min/{1.73_m2} (ref >60)
GFR est non-AA: >60 mL/min/{1.73_m2} (ref >60)
Glucose: 104 mg/dL — ABNORMAL HIGH (ref 65–100)
Potassium: 4.5 mmol/L (ref 3.5–5.1)
Sodium: 138 mmol/L (ref 136–145)

## 2015-03-29 LAB — EKG, 12 LEAD, SUBSEQUENT
Atrial Rate: 65 {beats}/min
Calculated P Axis: 34 degrees
Calculated R Axis: 38 degrees
Calculated T Axis: 44 degrees
P-R Interval: 122 ms
Q-T Interval: 420 ms
QRS Duration: 76 ms
QTC Calculation (Bezet): 436 ms
Ventricular Rate: 65 {beats}/min

## 2015-03-29 LAB — METABOLIC PANEL, COMPREHENSIVE
A-G Ratio: 0.8 — ABNORMAL LOW (ref 1.1–2.2)
ALT (SGPT): 396 U/L — ABNORMAL HIGH (ref 12–78)
AST (SGOT): 277 U/L — ABNORMAL HIGH (ref 15–37)
Albumin: 3.3 g/dL — ABNORMAL LOW (ref 3.5–5.0)
Alk. phosphatase: 223 U/L — ABNORMAL HIGH (ref 45–117)
Anion gap: 7 mmol/L (ref 5–15)
BUN/Creatinine ratio: 6 — ABNORMAL LOW (ref 12–20)
BUN: 4 MG/DL — ABNORMAL LOW (ref 6–20)
Bilirubin, total: 3 MG/DL — ABNORMAL HIGH (ref 0.2–1.0)
CO2: 27 mmol/L (ref 21–32)
Calcium: 8.2 MG/DL — ABNORMAL LOW (ref 8.5–10.1)
Chloride: 104 mmol/L (ref 97–108)
Creatinine: 0.72 MG/DL (ref 0.55–1.02)
GFR est AA: >60 mL/min/{1.73_m2} (ref >60)
GFR est non-AA: >60 mL/min/{1.73_m2} (ref >60)
Globulin: 3.9 g/dL (ref 2.0–4.0)
Glucose: 88 mg/dL (ref 65–100)
Potassium: 4 mmol/L (ref 3.5–5.1)
Protein, total: 7.2 g/dL (ref 6.4–8.2)
Sodium: 138 mmol/L (ref 136–145)

## 2015-03-29 LAB — CBC W/O DIFF
HCT: 38.2 % (ref 35.0–47.0)
HGB: 12.6 g/dL (ref 11.5–16.0)
MCH: 29.2 PG (ref 26.0–34.0)
MCHC: 33 g/dL (ref 30.0–36.5)
MCV: 88.6 FL (ref 80.0–99.0)
PLATELET: 277 10*3/uL (ref 150–400)
RBC: 4.31 M/uL (ref 3.80–5.20)
RDW: 12.9 % (ref 11.5–14.5)
WBC: 6.4 10*3/uL (ref 3.6–11.0)

## 2015-03-29 LAB — TROPONIN I: Troponin-I, Qt.: <0.04 ng/mL (ref <0.05)

## 2015-03-29 LAB — T4, FREE: T4, Free: 1.7 NG/DL — ABNORMAL HIGH (ref 0.8–1.5)

## 2015-03-29 LAB — GLUCOSE, POC: Glucose (POC): 88 mg/dL (ref 65–100)

## 2015-03-29 LAB — HGB & HCT
HCT: 38.7 % (ref 35.0–47.0)
HGB: 12.9 g/dL (ref 11.5–16.0)

## 2015-03-29 MED ORDER — PANTOPRAZOLE 40 MG IV SOLR
40 mg | Freq: Every day | INTRAVENOUS | Status: DC
Start: 2015-03-29 — End: 2015-03-28

## 2015-03-29 MED ORDER — PANTOPRAZOLE 40 MG IV SOLR
40 mg | Freq: Every day | INTRAVENOUS | Status: DC
Start: 2015-03-29 — End: 2015-03-30
  Administered 2015-03-29 – 2015-03-30 (×3): via INTRAVENOUS

## 2015-03-29 MED ORDER — SODIUM CHLORIDE 0.9 % IV
INTRAVENOUS | Status: DC
Start: 2015-03-29 — End: 2015-03-28

## 2015-03-29 MED ORDER — MORPHINE 2 MG/ML INJECTION
2 mg/mL | Freq: Once | INTRAMUSCULAR | Status: AC
Start: 2015-03-29 — End: 2015-03-28
  Administered 2015-03-29: 01:00:00 via INTRAVENOUS

## 2015-03-29 MED ORDER — HYDROMORPHONE (PF) 1 MG/ML IJ SOLN
1 mg/mL | INTRAMUSCULAR | Status: DC | PRN
Start: 2015-03-29 — End: 2015-03-30
  Administered 2015-03-29 – 2015-03-30 (×6): via INTRAVENOUS

## 2015-03-29 MED ORDER — MORPHINE 2 MG/ML INJECTION
2 mg/mL | INTRAMUSCULAR | Status: DC | PRN
Start: 2015-03-29 — End: 2015-03-29
  Administered 2015-03-29 (×3): via INTRAVENOUS

## 2015-03-29 MED FILL — DIPRIVAN 10 MG/ML INTRAVENOUS EMULSION: 10 mg/mL | INTRAVENOUS | Qty: 150

## 2015-03-29 MED FILL — SODIUM CHLORIDE 0.9 % IV: INTRAVENOUS | Qty: 1000

## 2015-03-29 MED FILL — HYDROMORPHONE (PF) 1 MG/ML IJ SOLN: 1 mg/mL | INTRAMUSCULAR | Qty: 1

## 2015-03-29 MED FILL — METRONIDAZOLE IN SODIUM CHLORIDE (ISO-OSM) 500 MG/100 ML IV PIGGY BACK: 500 mg/100 mL | INTRAVENOUS | Qty: 100

## 2015-03-29 MED FILL — BD POSIFLUSH NORMAL SALINE 0.9 % INJECTION SYRINGE: INTRAMUSCULAR | Qty: 10

## 2015-03-29 MED FILL — SODIUM CHLORIDE 0.9 % INJECTION: INTRAMUSCULAR | Qty: 10

## 2015-03-29 MED FILL — MORPHINE 2 MG/ML INJECTION: 2 mg/mL | INTRAMUSCULAR | Qty: 1

## 2015-03-29 MED FILL — SODIUM CHLORIDE 0.9 % IV PIGGY BACK: INTRAVENOUS | Qty: 50

## 2015-03-29 MED FILL — LACTATED RINGERS IV: INTRAVENOUS | Qty: 1000

## 2015-03-29 MED FILL — QUELICIN 20 MG/ML INJECTION SOLUTION: 20 mg/mL | INTRAMUSCULAR | Qty: 160

## 2015-03-29 MED FILL — ROCURONIUM 10 MG/ML IV: 10 mg/mL | INTRAVENOUS | Qty: 30

## 2015-03-29 MED FILL — LIDOCAINE (PF) 20 MG/ML (2 %) IJ SOLN: 20 mg/mL (2 %) | INTRAMUSCULAR | Qty: 20

## 2015-03-29 MED FILL — OFIRMEV 1,000 MG/100 ML (10 MG/ML) INTRAVENOUS SOLUTION: 1000 mg/100 mL (10 mg/mL) | INTRAVENOUS | Qty: 1000

## 2015-03-29 MED FILL — PHENYLEPHRINE 10 MG/ML INJECTION: 10 mg/mL | INTRAMUSCULAR | Qty: 400

## 2015-03-29 MED FILL — NEOSTIGMINE METHYLSULFATE 1 MG/ML INJECTION: 1 mg/mL | INTRAMUSCULAR | Qty: 3

## 2015-03-29 MED FILL — ONDANSETRON (PF) 4 MG/2 ML INJECTION: 4 mg/2 mL | INTRAMUSCULAR | Qty: 4

## 2015-03-29 MED FILL — GLYCOPYRROLATE 0.2 MG/ML IJ SOLN: 0.2 mg/mL | INTRAMUSCULAR | Qty: 0.6

## 2015-03-29 NOTE — Other (Signed)
Called report to Diane Nelson on Gen Surg.  Answered all questions, used SBAR, Kardex, MAR and doc flow.  Informed him I would be here til 1130.

## 2015-03-29 NOTE — Progress Notes (Signed)
Admit Date: 03/26/2015    POD 1 Day Post-Op    Procedure:  Procedure(s):  CHOLECYSTECTOMY LAPAROSCOPIC WITH GRAMS    Subjective:     Patient has complaints of pain.     Objective:     Blood pressure 108/78, pulse 68, temperature 97.9 ??F (36.6 ??C), resp. rate 16, height 5' 6" (1.676 m), weight 179 lb (81.194 kg), last menstrual period 03/24/2015, SpO2 96 %.    Temp (24hrs), Avg:98 ??F (36.7 ??C), Min:97.6 ??F (36.4 ??C), Max:98.6 ??F (37 ??C)      Physical Exam:  GENERAL: alert, cooperative, no distress, appears stated age, LUNG: nl effort, HEART: regular rate and rhythm, ABDOMEN: surg sites stable JP min output, EXTREMITIES:  extremities normal, atraumatic, no cyanosis or edema    Labs:   Recent Results (from the past 24 hour(s))   GLUCOSE, POC    Collection Time: 03/28/15  8:36 PM   Result Value Ref Range    Glucose (POC) 88 65 - 100 mg/dL    Performed by [place], BASIC    Collection Time: 03/28/15  8:45 PM   Result Value Ref Range    Sodium 138 136 - 145 mmol/L    Potassium 4.5 3.5 - 5.1 mmol/L    Chloride 102 97 - 108 mmol/L    CO2 25 21 - 32 mmol/L    Anion gap 11 5 - 15 mmol/L    Glucose 104 (H) 65 - 100 mg/dL    BUN 7 6 - 20 MG/DL    Creatinine 0.77 0.55 - 1.02 MG/DL    BUN/Creatinine ratio 9 (L) 12 - 20      GFR est AA >60 >60 ml/min/1.17m    GFR est non-AA >60 >60 ml/min/1.783m   Calcium 8.9 8.5 - 10.1 MG/DL   TROPONIN I    Collection Time: 03/28/15  8:45 PM   Result Value Ref Range    Troponin-I, Qt. <0.04 <0.05 ng/mL   HGB & HCT    Collection Time: 03/28/15  8:45 PM   Result Value Ref Range    HGB 12.9 11.5 - 16.0 g/dL    HCT 38.7 35.0 - 47.0 %   T4, FREE    Collection Time: 03/29/15  5:03 AM   Result Value Ref Range    T4, Free 1.7 (H) 0.8 - 1.5 NG/DL   METABOLIC PANEL, COMPREHENSIVE    Collection Time: 03/29/15  5:03 AM   Result Value Ref Range    Sodium 138 136 - 145 mmol/L    Potassium 4.0 3.5 - 5.1 mmol/L    Chloride 104 97 - 108 mmol/L    CO2 27 21 - 32 mmol/L     Anion gap 7 5 - 15 mmol/L    Glucose 88 65 - 100 mg/dL    BUN 4 (L) 6 - 20 MG/DL    Creatinine 0.72 0.55 - 1.02 MG/DL    BUN/Creatinine ratio 6 (L) 12 - 20      GFR est AA >60 >60 ml/min/1.7357m  GFR est non-AA >60 >60 ml/min/1.74m41m Calcium 8.2 (L) 8.5 - 10.1 MG/DL    Bilirubin, total 3.0 (H) 0.2 - 1.0 MG/DL    ALT 396 (H) 12 - 78 U/L    AST 277 (H) 15 - 37 U/L    Alk. phosphatase 223 (H) 45 - 117 U/L    Protein, total 7.2 6.4 - 8.2 g/dL    Albumin 3.3 (  L) 3.5 - 5.0 g/dL    Globulin 3.9 2.0 - 4.0 g/dL    A-G Ratio 0.8 (L) 1.1 - 2.2     CBC W/O DIFF    Collection Time: 03/29/15  5:03 AM   Result Value Ref Range    WBC 6.4 3.6 - 11.0 K/uL    RBC 4.31 3.80 - 5.20 M/uL    HGB 12.6 11.5 - 16.0 g/dL    HCT 38.2 35.0 - 47.0 %    MCV 88.6 80.0 - 99.0 FL    MCH 29.2 26.0 - 34.0 PG    MCHC 33.0 30.0 - 36.5 g/dL    RDW 12.9 11.5 - 14.5 %    PLATELET 277 150 - 400 K/uL       Data Review images and reports reviewed  LFT rising  Assessment:     Active Problems:    Cholelithiasis (03/26/2015)      LFT elevation (03/26/2015)      Common bile duct (CBD) obstruction (03/27/2015)      Needs ERCP  Plan/Recommendations/Medical Decision Making:     Continue present treatment  hopefully agrees to ERCP today  Adjust pain med      Norva Riffle. Winfred Leeds, MD, [facility]

## 2015-03-29 NOTE — Progress Notes (Signed)
FCC has been unable to locate any insurance for her other than medicaid [place] of [place].  CM left a message for [facility] for financial screening and any other options she may have. Bryson Ha. Poythress RN [phone]

## 2015-03-29 NOTE — Progress Notes (Signed)
Nutrition Assessment:    INTERVENTIONS/RECOMMENDATIONS:   Meals/Snacks: General/healthful diet: advance diet as medically able and tolerated     ASSESSMENT:   Patient medically noted for common bile duct obstruction, cholelithiasis, and s/p cholecystectomy. Patient received clear liquids last night and NPO today for possible ERCP. Patient is deciding if she really wants the procedure as she is complaining of abdominal soreness. If patient not to have procedure recommend advancing diet as tolerated. Otherwise, diet advanced as tolerated s/p procedure. Will continue to monitor progress and plan of care.     Diet Order: NPO  % Eaten:  No data found.    Pertinent Medications:  Reviewed Other: flagyl, protonix, miralax  Pertinent Labs: Reviewed  Other: elevated LFTs  Food Allergies: None Other:     Last BM:    Active     Hyperactive  Hypoactive        Absent  BS  Skin:     Intact    Incision   Breakdown   Edema   Other:    Anthropometrics: Height: 5\' 6"  (167.6 cm) Weight: 81.194 kg (179 lb)    IBW (%IBW):   ( ) UBW (%UBW):   (  %)    BMI: Body mass index is 28.91 kg/(m^2).    This BMI is indicative of:  Underweight   Normal   Overweight    Obesity    Extreme Obesity (BMI>40)  Last Weight Metrics:  Weight Loss Metrics 03/28/2015 03/26/2015   Today's Wt 179 lb -   BMI - 28.91 kg/m2       Estimated Nutrition Needs (Based on): 1800 Kcals/day (BMR (1577) x 1.3AF -250kcal) , 66 g (0.8 g/kg bw) Protein  Carbohydrate: At Least 130 g/day  Fluids: 1800 mL/day     Pt expected to meet estimated nutrient needs: Yes No: currently NPO    NUTRITION DIAGNOSES:   Problem:  Inadequate protein-energy intake Unintended weight loss    Etiology: related to current medical condition poor appetite    Signs/Symptoms: as evidenced by NPO status  reported 15# weight loss x 3 months (7.7%)    NUTRITION INTERVENTIONS:  Meals/Snacks: General/healthful diet                  GOAL:   Diet advanced next 1-3 days     NUTRITION MONITORING AND EVALUATION    Food/Nutrient Intake Outcomes: Total energy intake  Physical Signs/Symptoms Outcomes: Weight/weight change    Previous Goal Met:    Met               Progressing Towards Goal               Not Progressing Towards Goal   Previous Recommendations:    Implemented           Not Implemented           Not Applicable    LEARNING NEEDS (Diet, Food/Nutrient-Drug Interaction):     None Identified    Identified and Education Provided/Documented    Identified and Pt declined/was not appropriate     Cultural, Religious, OR Ethnic Dietary Needs:     None Identified    Identified and Addressed      Interdisciplinary Care Plan Reviewed/Documented     Discharge Planning: resume regular diet as tolerated     Participated in Interdisciplinary Rounds    NUTRITION RISK:     High               Moderate  Low    Minimal/Uncompromised      Rae Roam  Pager [phone]  Weekend Pager [phone]

## 2015-03-29 NOTE — Progress Notes (Signed)
Bedside and Verbal shift change report given to Trish,RN Control and instrumentation engineer(oncoming nurse) by Alcario DroughtErica, RN (offgoing nurse). Report included the following information SBAR, Kardex, ED Summary, OR Summary, Procedure Summary, Intake/Output, MAR, Recent Results and Cardiac Rhythm NSR.    Zone Phone:   [phone]      Significant changes during shift:  NSR        Patient Information    Diane Nelson  28 y.o.  03/26/2015  8:53 PM by Theodora BlowWarit Jithpratuck, MD. Diane Nelson was admitted from Home    Problem List    Patient Active Problem List    Diagnosis Date Noted   ??? Common bile duct (CBD) obstruction 03/27/2015   ??? Cholelithiasis 03/26/2015   ??? LFT elevation 03/26/2015     History reviewed. No pertinent past medical history.      Core Measures:    CVA: NO NO  CHF:NO NO  PNA:NO NO    Post Op Surgical (If Applicable):     Number times ambulated in hallway past shift:  none  Number of times OOB to chair past shift:   none  NG Tube: NO  Incentive Spirometer: NO  Drains: NO   Volume  n/a  Dressing Present:  NO  Flatus:  YES    Activity Status:    OOB to Chair NO  Ambulated this shift NO   Bed Rest YES    Supplemental O2: (If Applicable)    NC YES  NRB NO  Venti-mask NO  On 2 Liters/min      LINES AND DRAINS:    20g L AC    DVT prophylaxis:    DVT prophylaxis Med- NO  DVT prophylaxis SCD or TED- YES     Wounds: (If Applicable)    Wounds- YES    Location # 3 Abd sites with dermabond and drain    Patient Safety:    Falls Score Total Score: 3  Safety Level_______  Bed Alarm On? NO  Sitter? NO    Plan for upcoming shift: MRCP, Pain management        Discharge Plan: YES Ongoing    Active Consults:  IP CONSULT TO GENERAL SURGERY  IP CONSULT TO GASTROENTEROLOGY  IP CONSULT TO HOSPITALIST

## 2015-03-29 NOTE — Progress Notes (Signed)
GI NOTE    The OR is still doing add on emergency cases and will not be able to do her ERCP today.   I have scheduled it for Monday at 2 pm. If she worsens over the weekend my on call partners will do it.

## 2015-03-29 NOTE — Progress Notes (Signed)
GI Progress Note Diane Nelson(Diane Nelson)  NAME:Diane Nelson DOB:[birth year] RUE:[id number]   ATTG: Dr. Oletha CruelGrillon  PCP: None  Date/Time:  03/29/2015  Assessment:   ?? Acute cholecystitis s/p cholecystectomy this afternoon  ?? Suspected CBD stone/biliary obx on IOC  ?? Mild developmental delay     Plan:   ?? Possible ERCP today, she's wanting to wait due to abdominal discomfort  ?? If declines ERCP today, can set up for next week  ?? Continue NPO for now  ?? Continue abx  ?? Discussed risks, benefits, alternatives with her this AM     Subjective:   Successful lap choley yesterday. See op note for details.   IOC suggestive of CBD stone.    Feels lot of abdominal soreness today. Not sure she wants to have an ERCP as her stomach hurts.  I told her she'd be asleep for the procedure, but she wants to wait as it hurts to move and turn right now. She will think this morning about if she wants to proceed now or wait.    Objective:   VITALS:   Last 24hrs VS reviewed since prior progress note. Most recent are:  Visit Vitals   Item Reading   ??? BP 111/73 mmHg   ??? Pulse 69   ??? Temp 97.6 ??F (36.4 ??C)   ??? Resp 16   ??? Ht 5\' 6"  (1.676 m)   ??? Wt 81.194 kg (179 lb)   ??? BMI 28.91 kg/m2   ??? SpO2 100%       Intake/Output Summary (Last 24 hours) at 03/29/15 0616  Last data filed at 03/29/15 0518   Gross per 24 hour   Intake   5070 ml   Output   1065 ml   Net   4005 ml     PHYSICAL EXAM:  General: WD, WN. Alert, cooperative, no acute distress????  HEENT: NC, Atraumatic. Anicteric sclerae.  Lungs:  CTA Bilaterally. No Wheezing/Rhonchi/Rales.  Heart:  Regular  rhythm,?? No murmur (), No Rubs, No Gallops  Abdomen: Soft, diffusely tender throughout but comfortable at rest    Wound well-approximated, drain on R  Extremities: No c/c/e  Neurologic:?? Alert and oriented X 3.  No acute neurological distress   Psych:???? Not anxious nor agitated.    Lab and Radiology Data Reviewed: (see below)    Medications Reviewed: (see below)  PMH/SH reviewed - no change compared to H&P   ________________________________________________________________________  Total time spent with patient: 10 minutes ________________________________________________________________________  Care Plan discussed with:  Patient x   Family     RN               Surgery:       Sandy SalaamAlex R Orena Cavazos, MD     Procedures: see electronic medical records for all procedures/Xrays and details which were not copied into this note but were reviewed prior to creation of Plan.      LABS:  Recent Labs      03/29/15   0503  03/28/15   2045  03/28/15   0403   WBC  6.4   --   5.0   HGB  12.6  12.9  12.2   HCT  38.2  38.7  36.5   PLT  277   --   279     Recent Labs      03/28/15   2045  03/28/15   0403  03/27/15   0526  03/26/15   1754   NA  138  140  143  138   K  4.5  3.8  4.3  4.8   CL  102  106  109*  102   CO2  BUN  7  5*  8  12   CREA  0.77  0.68  0.78  1.01   GLU  104*  87  69  99   CA  8.9  8.3*  8.3*  9.2   MG   --   2.0   --   2.4     Recent Labs      03/28/15   0403  03/27/15   0526  03/26/15   1754   SGOT  175*  336*  361*   AP  194*  212*  220*   TP  6.8  7.2  8.1   ALB  3.2*  3.4*  4.0   GLOB  3.6  3.8  4.1*   LPSE  85   --   120     No results for input(s): INR, PTP, APTT in the last 72 hours.    Invalid input(s): INREXT, INREXT   No results for input(s): FE, TIBC, PSAT, FERR in the last 72 hours.   Lab Results   Component Value Date/Time    FOLATE 29.6 03/28/2015 04:03 AM     No results for input(s): PH, PCO2, PO2 in the last 72 hours.  No results for input(s): CPK, CKMB in the last 72 hours.    Invalid input(s): TROPONINI  Lab Results   Component Value Date/Time    COLOR AMBER 03/26/2015 06:27 PM    APPEARANCE CLOUDY 03/26/2015 06:27 PM    SPECIFIC GRAVITY 1.030 03/26/2015 06:27 PM    PH (UA) 6.0 03/26/2015 06:27 PM    PROTEIN NEGATIVE  03/26/2015 06:27 PM    GLUCOSE NEGATIVE  03/26/2015 06:27 PM    KETONE TRACE 03/26/2015 06:27 PM    UROBILINOGEN 1.0 03/26/2015 06:27 PM    NITRITES POSITIVE 03/26/2015 06:27 PM     LEUKOCYTE ESTERASE SMALL 03/26/2015 06:27 PM    EPITHELIAL CELLS FEW 03/26/2015 06:27 PM    BACTERIA 1+ 03/26/2015 06:27 PM    WBC 5-10 03/26/2015 06:27 PM    RBC 0-5 03/26/2015 06:27 PM       MEDICATIONS:  Current Facility-Administered Medications   Medication Dose Route Frequency   ??? lactated ringers infusion  25 mL/hr IntraVENous CONTINUOUS   ??? pantoprazole (PROTONIX) 40 mg in sodium chloride 0.9 % 10 mL injection  40 mg IntraVENous DAILY   ??? morphine injection 1 mg  1 mg IntraVENous Q2H PRN   ??? sodium chloride (NS) flush 5-10 mL  5-10 mL IntraVENous Q8H   ??? sodium chloride (NS) flush 5-10 mL  5-10 mL IntraVENous PRN   ??? metroNIDAZOLE (FLAGYL) IVPB premix 500 mg  500 mg IntraVENous Q8H   ??? cefTRIAXone (ROCEPHIN) 1 g in 0.9% sodium chloride (MBP/ADV) 50 mL  1 g IntraVENous Q24H   ??? polyethylene glycol (MIRALAX) packet 17 g  17 g Oral DAILY   ??? dextrose 5% and 0.9% NaCl infusion  100 mL/hr IntraVENous CONTINUOUS

## 2015-03-29 NOTE — Progress Notes (Signed)
FCC has been unable to get any information regarding insurance. Ms. Diane Nelson with [facility] plans to meet the patient to assist her in application for [place].  Bryson Ha. Poythress RN [phone]

## 2015-03-29 NOTE — Other (Signed)
1058 Page out to dr. Oletha Cruelgrillon for patient nausea and vomitting upon arrival to the floor.  1102 Received orders for zofran 4 mg iv q6h prn n/v, cmp in am, and clear liquid diet per dr. Oletha Cruelgrillon

## 2015-03-30 LAB — METABOLIC PANEL, COMPREHENSIVE
A-G Ratio: 0.9 — ABNORMAL LOW (ref 1.1–2.2)
ALT (SGPT): 369 U/L — ABNORMAL HIGH (ref 12–78)
AST (SGOT): 194 U/L — ABNORMAL HIGH (ref 15–37)
Albumin: 3.4 g/dL — ABNORMAL LOW (ref 3.5–5.0)
Alk. phosphatase: 203 U/L — ABNORMAL HIGH (ref 45–117)
Anion gap: 8 mmol/L (ref 5–15)
BUN/Creatinine ratio: 6 — ABNORMAL LOW (ref 12–20)
BUN: 4 MG/DL — ABNORMAL LOW (ref 6–20)
Bilirubin, total: 1.2 MG/DL — ABNORMAL HIGH (ref 0.2–1.0)
CO2: 25 mmol/L (ref 21–32)
Calcium: 8.9 MG/DL (ref 8.5–10.1)
Chloride: 105 mmol/L (ref 97–108)
Creatinine: 0.7 MG/DL (ref 0.55–1.02)
GFR est AA: >60 mL/min/{1.73_m2} (ref >60)
GFR est non-AA: >60 mL/min/{1.73_m2} (ref >60)
Globulin: 4 g/dL (ref 2.0–4.0)
Glucose: 98 mg/dL (ref 65–100)
Potassium: 3.9 mmol/L (ref 3.5–5.1)
Protein, total: 7.4 g/dL (ref 6.4–8.2)
Sodium: 138 mmol/L (ref 136–145)

## 2015-03-30 MED ORDER — ONDANSETRON (PF) 4 MG/2 ML INJECTION
4 mg/2 mL | Freq: Four times a day (QID) | INTRAMUSCULAR | Status: DC | PRN
Start: 2015-03-30 — End: 2015-03-30

## 2015-03-30 MED ORDER — SODIUM CHLORIDE 0.9 % IV
2 mg/mL | Freq: Four times a day (QID) | INTRAVENOUS | Status: DC | PRN
Start: 2015-03-30 — End: 2015-03-29

## 2015-03-30 MED ORDER — PANTOPRAZOLE 40 MG TAB, DELAYED RELEASE
40 mg | Freq: Every day | ORAL | Status: DC
Start: 2015-03-30 — End: 2015-04-02
  Administered 2015-03-31 – 2015-04-02 (×2): via ORAL

## 2015-03-30 MED ORDER — ONDANSETRON (PF) 4 MG/2 ML INJECTION
4 mg/2 mL | Freq: Four times a day (QID) | INTRAMUSCULAR | Status: DC | PRN
Start: 2015-03-30 — End: 2015-04-02
  Administered 2015-03-30 – 2015-04-02 (×3): via INTRAVENOUS

## 2015-03-30 MED ORDER — HYDROMORPHONE (PF) 1 MG/ML IJ SOLN
1 mg/mL | INTRAMUSCULAR | Status: DC | PRN
Start: 2015-03-30 — End: 2015-04-02
  Administered 2015-03-30 – 2015-04-02 (×20): via INTRAVENOUS

## 2015-03-30 MED FILL — HYDROMORPHONE (PF) 1 MG/ML IJ SOLN: 1 mg/mL | INTRAMUSCULAR | Qty: 1

## 2015-03-30 MED FILL — HEALTHYLAX 17 GRAM ORAL POWDER PACKET: 17 gram | ORAL | Qty: 1

## 2015-03-30 MED FILL — BD POSIFLUSH NORMAL SALINE 0.9 % INJECTION SYRINGE: INTRAMUSCULAR | Qty: 10

## 2015-03-30 MED FILL — HYDROMORPHONE (PF) 1 MG/ML IJ SOLN: 1 mg/mL | INTRAMUSCULAR | Qty: 2

## 2015-03-30 MED FILL — SODIUM CHLORIDE 0.9 % IV PIGGY BACK: INTRAVENOUS | Qty: 50

## 2015-03-30 MED FILL — ONDANSETRON (PF) 4 MG/2 ML INJECTION: 4 mg/2 mL | INTRAMUSCULAR | Qty: 2

## 2015-03-30 MED FILL — METRONIDAZOLE IN SODIUM CHLORIDE (ISO-OSM) 500 MG/100 ML IV PIGGY BACK: 500 mg/100 mL | INTRAVENOUS | Qty: 100

## 2015-03-30 MED FILL — PROTONIX 40 MG INTRAVENOUS SOLUTION: 40 mg | INTRAVENOUS | Qty: 40

## 2015-03-30 NOTE — Progress Notes (Signed)
Admit Date: 03/26/2015    POD 2 Days Post-Op    Procedure:  Procedure(s):  CHOLECYSTECTOMY LAPAROSCOPIC WITH GRAMS    Subjective:     Patient has no new complaints. Just tired    Objective:     Blood pressure 102/67, pulse 60, temperature 97.4 ??F (36.3 ??C), resp. rate 18, height 5' 6"  (1.676 m), weight 179 lb (81.194 kg), last menstrual period 03/24/2015, SpO2 100 %.    Temp (24hrs), Avg:97.6 ??F (36.4 ??C), Min:97.4 ??F (36.3 ??C), Max:97.9 ??F (36.6 ??C)      Physical Exam:  GENERAL: fatigued, cooperative, no distress, appears stated age, LUNG: nl effort, HEART: regular rate and rhythm, ABDOMEN: soft, non-tender. Bowel sounds normal. No masses,  no organomegaly, EXTREMITIES:  extremities normal, atraumatic, no cyanosis or edema    Labs:   Recent Results (from the past 24 hour(s))   METABOLIC PANEL, COMPREHENSIVE    Collection Time: 03/30/15  9:53 AM   Result Value Ref Range    Sodium 138 136 - 145 mmol/L    Potassium 3.9 3.5 - 5.1 mmol/L    Chloride 105 97 - 108 mmol/L    CO2 25 21 - 32 mmol/L    Anion gap 8 5 - 15 mmol/L    Glucose 98 65 - 100 mg/dL    BUN 4 (L) 6 - 20 MG/DL    Creatinine 0.70 0.55 - 1.02 MG/DL    BUN/Creatinine ratio 6 (L) 12 - 20      GFR est AA >60 >60 ml/min/1.37m    GFR est non-AA >60 >60 ml/min/1.753m   Calcium 8.9 8.5 - 10.1 MG/DL    Bilirubin, total 1.2 (H) 0.2 - 1.0 MG/DL    ALT 369 (H) 12 - 78 U/L    AST 194 (H) 15 - 37 U/L    Alk. phosphatase 203 (H) 45 - 117 U/L    Protein, total 7.4 6.4 - 8.2 g/dL    Albumin 3.4 (L) 3.5 - 5.0 g/dL    Globulin 4.0 2.0 - 4.0 g/dL    A-G Ratio 0.9 (L) 1.1 - 2.2         Data Review reviewed  Consultants documentation, I & O and labs    Assessment:     Active Problems:    Cholelithiasis (03/26/2015)      LFT elevation (03/26/2015)      Common bile duct (CBD) obstruction (03/27/2015)    Awaiting ERCP, now sched for Monday    LFT down some, ? Floating stone vs passed stone    Plan/Recommendations/Medical Decision Making:     Continue present treatment  Diet  clear   labs in AM       MiNorva RiffleGrCheri KearnsMRDorothea Dix Psychiatric Centernpatient Surgical Specialists

## 2015-03-30 NOTE — Other (Addendum)
Patient complaints of pain 9/10 after 30 minutes after dose of dilaudid per mar.  [id number] Dr. Oletha Cruelgrillon was paged and received order for 2mg  dilaudid iv q3h prn severe pain. This is in conjunction with her 1 mg of dilaudid she already is receiving per the mar. Clarified with the doctor to ensure and this was the plan.  0430 patient refusing blood draws at this time.  0500 patient still refusing sticks at this time wants to be more comfortable prior.  [id number] patient was finally agreeable to lab draws and this nurse was unable to draw blood. This was passed on to the day nurse Nadeen LandauMichelle RN.

## 2015-03-30 NOTE — Progress Notes (Signed)
GI Progress Note Diane Nelson[facility]  NAME:Diane Nelson DOB:1985/10/28 ZOX:[id number]   ATTG: Dr. Oletha Nelson  PCP: None  Date/Time:  03/30/2015  Assessment:   ?? Acute cholecystitis s/p cholecystectomy   ?? Suspected CBD stone/biliary obx on IOC  ?? Mild developmental delay     Plan:   ?? ERCP on Monday with Dr. Sandie Anouckworth  ?? NPO p MN on sunday  ?? Continue abx  ?? Previously reviewed risks, benefits, alternatives with family, will re-discuss tomorrow     Subjective:   Called back for positive IOC, possible choledocholithiasis at level of ampulla.    Patient still in the OR/PACU.     Objective:   VITALS:   Last 24hrs VS reviewed since prior progress note. Most recent are:  Visit Vitals   Item Reading   ??? BP 109/54 mmHg   ??? Pulse 60   ??? Temp 97.4 ??F (36.3 ??C)   ??? Resp 18   ??? Ht 5\' 6" (1.676 m)   ??? Wt 81.194 kg (179 lb)   ??? BMI 28.91 kg/m2   ??? SpO2 99%       Intake/Output Summary (Last 24 hours) at 03/30/15 1400  Last data filed at 03/29/15 2209   Gross per 24 hour   Intake      0 ml   Output     30 ml   Net    -30 ml     PHYSICAL EXAM:  Mild tenderness RUQ, otherwise unremarkable.    Lab and Radiology Data Reviewed: (see below)    Medications Reviewed: (see below)  PMH/SH reviewed - no change compared to H&P  ________________________________________________________________________  Total time spent with patient: 10 minutes ________________________________________________________________________  Care Plan discussed with:  Patient    Family     RN               Surgery:  x     Elyse Prevo, MD     Procedures: see electronic medical records for all procedures/Xrays and details which were not copied into this note but were reviewed prior to creation of Plan.      LABS:  Recent Labs      03/29/15   0503  03/28/15   2045  03/28/15   0403   WBC  6.4   --   5.0   HGB  12.6  12.9  12.2   HCT  38.2  38.7  36.5   PLT  277   --   279     Recent Labs      03/30/15   0953  03/29/15   0503  03/28/15   2045  03/28/15   0403    NA  138  138  138  140   K  3.9  4.0  4.5  3.8   CL  105  104  102  106   CO2  25  27  25  26   BUN  4*  4*  7  5*   CREA  0.70  0.72  0.77  0.68   GLU  98  88  104*  87   CA  8.9  8.2*  8.9  8.3*   MG   --    --    --   2.0     Recent Labs      07 /09/16   0953  03/29/15   0503  03/28/15   0403   SGOT  194*  277*  175*  AP  203*  223*  194*   TP  7.4  7.2  6.8   ALB  3.4*  3.3*  3.2*   GLOB  4.0  3.9  3.6   LPSE   --    --   85     No results for input(s): INR, PTP, APTT in the last 72 hours.    Invalid input(s): INREXT, INREXT   No results for input(s): FE, TIBC, PSAT, FERR in the last 72 hours.   Lab Results   Component Value Date/Time    FOLATE 29.6 03/28/2015 04:03 AM     No results for input(s): PH, PCO2, PO2 in the last 72 hours.  No results for input(s): CPK, CKMB in the last 72 hours.    Invalid input(s): TROPONINI  Lab Results   Component Value Date/Time    COLOR AMBER 03/26/2015 06:27 PM    APPEARANCE CLOUDY 03/26/2015 06:27 PM    SPECIFIC GRAVITY 1.030 03/26/2015 06:27 PM    PH (UA) 6.0 03/26/2015 06:27 PM    PROTEIN NEGATIVE  03/26/2015 06:27 PM    GLUCOSE NEGATIVE  03/26/2015 06:27 PM    KETONE TRACE 03/26/2015 06:27 PM    UROBILINOGEN 1.0 03/26/2015 06:27 PM    NITRITES POSITIVE 03/26/2015 06:27 PM    LEUKOCYTE ESTERASE SMALL 03/26/2015 06:27 PM    EPITHELIAL CELLS FEW 03/26/2015 06:27 PM    BACTERIA 1+ 03/26/2015 06:27 PM    WBC 5-10 03/26/2015 06:27 PM    RBC 0-5 03/26/2015 06:27 PM       MEDICATIONS:  Current Facility-Administered Medications   Medication Dose Route Frequency   ??? HYDROmorphone (PF) (DILAUDID) injection 2 mg  2 mg IntraVENous Q3H PRN   ??? [START ON 03/31/2015] pantoprazole (PROTONIX) tablet 40 mg  40 mg Oral ACB   ??? ondansetron (ZOFRAN) injection 4 mg  4 mg IntraVENous Q6H PRN   ??? sodium chloride (NS) flush 5-10 mL  5-10 mL IntraVENous Q8H   ??? sodium chloride (NS) flush 5-10 mL  5-10 mL IntraVENous PRN   ??? polyethylene glycol (MIRALAX) packet 17 g  17 g Oral DAILY    ??? dextrose 5% and 0.9% NaCl infusion  100 mL/hr IntraVENous CONTINUOUS

## 2015-03-30 NOTE — Progress Notes (Signed)
End of Shift Nursing Note    Bedside shift change report given to Leonette Mostharles (Cabin crewoncoming nurse) by Marcelino DusterMichelle (offgoing nurse). Report included the following information SBAR, Kardex and MAR.        Number times ambulated in hallway past shift: 1      Number of times OOB to chair past shift: 1         Vital Signs:    Temp: 98.3 ??F (36.8 ??C)     Pulse (Heart Rate): 75     BP: 98/53 mmHg     Resp Rate: 18     O2 Sat (%): 99 %      Opportunity for questions and clarification was given to oncoming nurse. Patient bed is in lowest position, side rails are up x 2, door & observation blinds open as needed, call bell within reach and patient not in distress.    Rhona LeavensMichelle N Weaver, RN    \\

## 2015-03-31 MED FILL — HYDROMORPHONE (PF) 1 MG/ML IJ SOLN: 1 mg/mL | INTRAMUSCULAR | Qty: 2

## 2015-03-31 MED FILL — BD POSIFLUSH NORMAL SALINE 0.9 % INJECTION SYRINGE: INTRAMUSCULAR | Qty: 10

## 2015-03-31 MED FILL — HEALTHYLAX 17 GRAM ORAL POWDER PACKET: 17 gram | ORAL | Qty: 1

## 2015-03-31 MED FILL — PROTONIX 40 MG TABLET,DELAYED RELEASE: 40 mg | ORAL | Qty: 1

## 2015-03-31 MED FILL — BD POSIFLUSH NORMAL SALINE 0.9 % INJECTION SYRINGE: INTRAMUSCULAR | Qty: 50

## 2015-03-31 NOTE — Other (Signed)
Patient currently refusing lab draws [phone]  Patient still refusing labs at 0700 day shift nurse is already aware.      Bedside shift change report given to Marcelino DusterMichelle RN (oncoming nurse) by Joanie Coddingtonharles RN (offgoing nurse). Report included the following information SBAR and Kardex.    Lynetta Mareharles J Fizer, RN

## 2015-03-31 NOTE — Progress Notes (Signed)
End of Shift Nursing Note    Bedside shift change report given to Clarisse GougeBridget (Cabin crewoncoming nurse) by Marcelino DusterMichelle (offgoing nurse). Report included the following information SBAR, Kardex and MAR.    Number times ambulated in hallway past shift :2     Number of times OOB to chair past shift: 2         Vital Signs:    Temp: 98.6 ??F (37 ??C)     Pulse (Heart Rate): 83     BP: 114/88 mmHg     Resp Rate: 18     O2 Sat (%): 96 %    FOpportunity for questions and clarification was given to oncoming nurse. Patient bed is in lowest position, side rails are up x 2, door & observation blinds open as needed, call bell within reach and patient not in distress.    Rhona LeavensMichelle N Weaver, RN

## 2015-03-31 NOTE — Progress Notes (Signed)
Continues to refuse AM labs

## 2015-03-31 NOTE — Progress Notes (Signed)
Admit Date: 03/26/2015    POD 3 Days Post-Op    Procedure:  Procedure(s):  CHOLECYSTECTOMY LAPAROSCOPIC WITH GRAMS    Subjective:     Patient has no new complaints. tol clears, less pain, up and walking around    Objective:     Blood pressure 108/50, pulse 65, temperature 98.4 ??F (36.9 ??C), resp. rate 18, height 5\' 6"  (1.676 m), weight 179 lb (81.194 kg), last menstrual period 03/24/2015, SpO2 97 %.    Temp (24hrs), Avg:98 ??F (36.7 ??C), Min:97.5 ??F (36.4 ??C), Max:98.4 ??F (36.9 ??C)      Physical Exam:  GENERAL: alert, cooperative, no distress, appears stated age, LUNG: nl effort, HEART: regular rate and rhythm, ABDOMEN: soft, non-tender. Bowel sounds normal. No masses,  no organomegaly, jp serousEXTREMITIES:  extremities normal, atraumatic, no cyanosis or edema    Labs: No results found for this or any previous visit (from the past 24 hour(s)).  Ordered labs not done this AM  Data Review reviewed  I & O    Assessment:     Active Problems:    Cholelithiasis (03/26/2015)      LFT elevation (03/26/2015)      Common bile duct (CBD) obstruction (03/27/2015)        Plan/Recommendations/Medical Decision Making:     Continue present treatment  Diet  full liquid  NPO after MN for ERCP tomorrow   Dr Lucretia RoersWood will assume Surgical Management in AM    Kelton PillarMichael S. Oletha CruelGrillon, MD, [facility] CenterFACS  [facility]

## 2015-03-31 NOTE — Progress Notes (Signed)
GI Progress Note [name] for [facility])  NAME:Diane Nelson DOB:1986-09-15 ZOX:[id number]   ATTG: Dr. Oletha Cruel  PCP: None  Date/Time:  03/31/2015  Assessment:   ?? Acute cholecystitis s/p cholecystectomy   ?? Suspected CBD stone/biliary obx on IOC  ?? Mild developmental delay     Plan:   ?? ERCP on Monday with Dr. Sandie Ano  ?? NPO p MN on /09/16   0953  03/29/15    0503   SGOT  194*  277*   AP  203*  223*   TP  7.4  7.2   ALB  3.4*  3.3*   GLOB  4.0  3.9     No results for input(s): INR, PTP, APTT in the last 72 hours.    Invalid input(s): INREXT, INREXT   No results for input(s): FE, TIBC, PSAT, FERR in the last 72 hours.   Lab Results   Component Value Date/Time    FOLATE 29.6 03/28/2015 04:03 AM     No results for input(s): PH, PCO2, PO2 in the last 72 hours.  No results for input(s): CPK,  CKMB in the last 72 hours.    Invalid input(s): TROPONINI  Lab Results   Component Value Date/Time    COLOR AMBER 03/26/2015 06:27 PM    APPEARANCE CLOUDY 03/26/2015 06:27 PM    SPECIFIC GRAVITY 1.030 03/26/2015 06:27 PM    PH (UA) 6.0 03/26/2015 06:27 PM    PROTEIN NEGATIVE  03/26/2015 06:27 PM    GLUCOSE NEGATIVE  03/26/2015 06:27 PM    KETONE TRACE 03/26/2015 06:27 PM    UROBILINOGEN 1.0 03/26/2015 06:27 PM    NITRITES POSITIVE 03/26/2015 06:27 PM    LEUKOCYTE ESTERASE SMALL 03/26/2015 06:27 PM    EPITHELIAL CELLS FEW 03/26/2015 06:27 PM    BACTERIA 1+ 03/26/2015 06:27 PM    WBC 5-10 03/26/2015 06:27 PM    RBC 0-5 03/26/2015 06:27 PM       MEDICATIONS:  Current Facility-Administered Medications   Medication Dose Route Frequency   ??? HYDROmorphone (PF) (DILAUDID) injection 2 mg  2 mg IntraVENous Q3H PRN   ??? pantoprazole (PROTONIX) tablet 40 mg  40 mg Oral ACB   ??? ondansetron (ZOFRAN) injection 4 mg  4 mg IntraVENous Q6H PRN   ??? sodium chloride (NS) flush 5-10 mL  5-10 mL IntraVENous Q8H   ??? sodium chloride (NS) flush 5-10 mL  5-10 mL IntraVENous PRN   ??? polyethylene glycol (MIRALAX) packet 17 g  17 g Oral DAILY   ??? dextrose 5% and 0.9% NaCl infusion  100 mL/hr IntraVENous CONTINUOUS

## 2015-04-01 LAB — METABOLIC PANEL, COMPREHENSIVE
A-G Ratio: 0.9 — ABNORMAL LOW (ref 1.1–2.2)
ALT (SGPT): 269 U/L — ABNORMAL HIGH (ref 12–78)
AST (SGOT): 128 U/L — ABNORMAL HIGH (ref 15–37)
Albumin: 3.1 g/dL — ABNORMAL LOW (ref 3.5–5.0)
Alk. phosphatase: 159 U/L — ABNORMAL HIGH (ref 45–117)
Anion gap: 4 mmol/L — ABNORMAL LOW (ref 5–15)
BUN/Creatinine ratio: 4 — ABNORMAL LOW (ref 12–20)
BUN: 3 MG/DL — ABNORMAL LOW (ref 6–20)
Bilirubin, total: 0.9 MG/DL (ref 0.2–1.0)
CO2: 30 mmol/L (ref 21–32)
Calcium: 8.7 MG/DL (ref 8.5–10.1)
Chloride: 107 mmol/L (ref 97–108)
Creatinine: 0.77 MG/DL (ref 0.55–1.02)
GFR est AA: >60 mL/min/{1.73_m2} (ref >60)
GFR est non-AA: >60 mL/min/{1.73_m2} (ref >60)
Globulin: 3.3 g/dL (ref 2.0–4.0)
Glucose: 92 mg/dL (ref 65–100)
Potassium: 4.9 mmol/L (ref 3.5–5.1)
Protein, total: 6.4 g/dL (ref 6.4–8.2)
Sodium: 141 mmol/L (ref 136–145)

## 2015-04-01 LAB — CBC W/O DIFF
HCT: 35 % (ref 35.0–47.0)
HGB: 11.5 g/dL (ref 11.5–16.0)
MCH: 30 PG (ref 26.0–34.0)
MCHC: 32.9 g/dL (ref 30.0–36.5)
MCV: 91.4 FL (ref 80.0–99.0)
PLATELET: 265 10*3/uL (ref 150–400)
RBC: 3.83 M/uL (ref 3.80–5.20)
RDW: 13.3 % (ref 11.5–14.5)
WBC: 6 10*3/uL (ref 3.6–11.0)

## 2015-04-01 LAB — LIPASE: Lipase: 103 U/L (ref 73–393)

## 2015-04-01 MED ORDER — PROPOFOL 10 MG/ML IV EMUL
10 mg/mL | INTRAVENOUS | Status: AC
Start: 2015-04-01 — End: ?

## 2015-04-01 MED ORDER — INDOMETHACIN 50 MG RECTAL SUPPOSITORY
50 mg | Freq: Once | RECTAL | Status: DC
Start: 2015-04-01 — End: 2015-04-01
  Administered 2015-04-01: 19:00:00 via RECTAL

## 2015-04-01 MED ORDER — SODIUM CHLORIDE 0.9 % IJ SYRG
INTRAMUSCULAR | Status: DC | PRN
Start: 2015-04-01 — End: 2015-04-01

## 2015-04-01 MED ORDER — ROCURONIUM 10 MG/ML IV
10 mg/mL | INTRAVENOUS | Status: DC | PRN
Start: 2015-04-01 — End: 2015-04-01
  Administered 2015-04-01: 18:00:00 via INTRAVENOUS

## 2015-04-01 MED ORDER — SODIUM CHLORIDE 0.9 % IJ SYRG
Freq: Three times a day (TID) | INTRAMUSCULAR | Status: DC
Start: 2015-04-01 — End: 2015-04-01
  Administered 2015-04-01: 18:00:00 via INTRAVENOUS

## 2015-04-01 MED ORDER — MEPERIDINE (PF) 25 MG/ML INJ SOLUTION
25 mg/ml | INTRAMUSCULAR | Status: DC | PRN
Start: 2015-04-01 — End: 2015-04-01

## 2015-04-01 MED ORDER — GLYCOPYRROLATE 0.2 MG/ML IJ SOLN
0.2 mg/mL | INTRAMUSCULAR | Status: AC
Start: 2015-04-01 — End: ?

## 2015-04-01 MED ORDER — PROPOFOL 10 MG/ML IV EMUL
10 mg/mL | INTRAVENOUS | Status: DC | PRN
Start: 2015-04-01 — End: 2015-04-01
  Administered 2015-04-01: 18:00:00 via INTRAVENOUS

## 2015-04-01 MED ORDER — LACTATED RINGERS IV
INTRAVENOUS | Status: DC
Start: 2015-04-01 — End: 2015-04-01
  Administered 2015-04-01: 19:00:00 via INTRAVENOUS

## 2015-04-01 MED ORDER — EPINEPHRINE 0.1 MG/ML SYRINGE
0.1 mg/mL | INTRAMUSCULAR | Status: AC
Start: 2015-04-01 — End: 2015-04-02
  Administered 2015-04-01: 19:00:00

## 2015-04-01 MED ORDER — ROCURONIUM 10 MG/ML IV
10 mg/mL | INTRAVENOUS | Status: AC
Start: 2015-04-01 — End: ?

## 2015-04-01 MED ORDER — MIDAZOLAM 1 MG/ML IJ SOLN
1 mg/mL | INTRAMUSCULAR | Status: DC | PRN
Start: 2015-04-01 — End: 2015-04-01
  Administered 2015-04-01: 18:00:00 via INTRAVENOUS

## 2015-04-01 MED ORDER — DEXAMETHASONE SODIUM PHOSPHATE 4 MG/ML IJ SOLN
4 mg/mL | INTRAMUSCULAR | Status: DC | PRN
Start: 2015-04-01 — End: 2015-04-01
  Administered 2015-04-01: 18:00:00 via INTRAVENOUS

## 2015-04-01 MED ORDER — IOTHALAMATE MEGLUMINE 60 % INJECTION
60 % | INTRAMUSCULAR | Status: AC
Start: 2015-04-01 — End: 2015-04-01
  Administered 2015-04-01: 15:00:00

## 2015-04-01 MED ORDER — ONDANSETRON (PF) 4 MG/2 ML INJECTION
4 mg/2 mL | INTRAMUSCULAR | Status: AC
Start: 2015-04-01 — End: ?

## 2015-04-01 MED ORDER — DIPHENHYDRAMINE HCL 50 MG/ML IJ SOLN
50 mg/mL | INTRAMUSCULAR | Status: DC | PRN
Start: 2015-04-01 — End: 2015-04-01

## 2015-04-01 MED ORDER — FENTANYL CITRATE (PF) 50 MCG/ML IJ SOLN
50 mcg/mL | INTRAMUSCULAR | Status: DC | PRN
Start: 2015-04-01 — End: 2015-04-01
  Administered 2015-04-01: 18:00:00 via INTRAVENOUS

## 2015-04-01 MED ORDER — SODIUM CHLORIDE 0.9% BOLUS IV
0.9 % | Freq: Once | INTRAVENOUS | Status: AC
Start: 2015-04-01 — End: 2015-04-01
  Administered 2015-04-01: 04:00:00 via INTRAVENOUS

## 2015-04-01 MED ORDER — SUCCINYLCHOLINE CHLORIDE 20 MG/ML INJECTION
20 mg/mL | INTRAMUSCULAR | Status: AC
Start: 2015-04-01 — End: ?

## 2015-04-01 MED ORDER — LIDOCAINE (PF) 20 MG/ML (2 %) IJ SOLN
20 mg/mL (2 %) | INTRAMUSCULAR | Status: DC | PRN
Start: 2015-04-01 — End: 2015-04-01
  Administered 2015-04-01: 18:00:00 via INTRAVENOUS

## 2015-04-01 MED ORDER — LIDOCAINE (PF) 20 MG/ML (2 %) IJ SOLN
20 mg/mL (2 %) | INTRAMUSCULAR | Status: AC
Start: 2015-04-01 — End: ?

## 2015-04-01 MED ORDER — HYDROMORPHONE (PF) 1 MG/ML IJ SOLN
1 mg/mL | INTRAMUSCULAR | Status: DC | PRN
Start: 2015-04-01 — End: 2015-04-01

## 2015-04-01 MED ORDER — SUCCINYLCHOLINE CHLORIDE 20 MG/ML INJECTION
20 mg/mL | INTRAMUSCULAR | Status: DC | PRN
Start: 2015-04-01 — End: 2015-04-01
  Administered 2015-04-01: 18:00:00 via INTRAVENOUS

## 2015-04-01 MED ORDER — DEXAMETHASONE SODIUM PHOSPHATE 4 MG/ML IJ SOLN
4 mg/mL | INTRAMUSCULAR | Status: AC
Start: 2015-04-01 — End: ?

## 2015-04-01 MED ORDER — FENTANYL CITRATE (PF) 50 MCG/ML IJ SOLN
50 mcg/mL | INTRAMUSCULAR | Status: AC
Start: 2015-04-01 — End: ?

## 2015-04-01 MED ORDER — PHENYLEPHRINE IN 0.9 % SODIUM CL (40 MCG/ML) IV SYRINGE
0.4 mg/10 mL (40 mcg/mL) | INTRAVENOUS | Status: AC
Start: 2015-04-01 — End: ?

## 2015-04-01 MED ORDER — FENTANYL CITRATE (PF) 50 MCG/ML IJ SOLN
50 mcg/mL | INTRAMUSCULAR | Status: DC | PRN
Start: 2015-04-01 — End: 2015-04-01
  Administered 2015-04-01: 19:00:00 via INTRAVENOUS

## 2015-04-01 MED ORDER — LACTATED RINGERS IV
INTRAVENOUS | Status: DC
Start: 2015-04-01 — End: 2015-04-01
  Administered 2015-04-01: 17:00:00 via INTRAVENOUS

## 2015-04-01 MED ORDER — ONDANSETRON (PF) 4 MG/2 ML INJECTION
4 mg/2 mL | INTRAMUSCULAR | Status: DC | PRN
Start: 2015-04-01 — End: 2015-04-01
  Administered 2015-04-01: 18:00:00 via INTRAVENOUS

## 2015-04-01 MED ORDER — LIDOCAINE (PF) 10 MG/ML (1 %) IJ SOLN
10 mg/mL (1 %) | INTRAMUSCULAR | Status: DC | PRN
Start: 2015-04-01 — End: 2015-04-01

## 2015-04-01 MED ORDER — MIDAZOLAM 1 MG/ML IJ SOLN
1 mg/mL | INTRAMUSCULAR | Status: AC
Start: 2015-04-01 — End: ?

## 2015-04-01 MED FILL — ONDANSETRON (PF) 4 MG/2 ML INJECTION: 4 mg/2 mL | INTRAMUSCULAR | Qty: 2

## 2015-04-01 MED FILL — HYDROMORPHONE (PF) 1 MG/ML IJ SOLN: 1 mg/mL | INTRAMUSCULAR | Qty: 2

## 2015-04-01 MED FILL — ROCURONIUM 10 MG/ML IV: 10 mg/mL | INTRAVENOUS | Qty: 5

## 2015-04-01 MED FILL — BD POSIFLUSH NORMAL SALINE 0.9 % INJECTION SYRINGE: INTRAMUSCULAR | Qty: 10

## 2015-04-01 MED FILL — DEXAMETHASONE SODIUM PHOSPHATE 4 MG/ML IJ SOLN: 4 mg/mL | INTRAMUSCULAR | Qty: 5

## 2015-04-01 MED FILL — PROPOFOL 10 MG/ML IV EMUL: 10 mg/mL | INTRAVENOUS | Qty: 20

## 2015-04-01 MED FILL — GLYCOPYRROLATE 0.2 MG/ML IJ SOLN: 0.2 mg/mL | INTRAMUSCULAR | Qty: 1

## 2015-04-01 MED FILL — LACTATED RINGERS IV: INTRAVENOUS | Qty: 1000

## 2015-04-01 MED FILL — QUELICIN 20 MG/ML INJECTION SOLUTION: 20 mg/mL | INTRAMUSCULAR | Qty: 10

## 2015-04-01 MED FILL — SODIUM CHLORIDE 0.9 % IV: INTRAVENOUS | Qty: 250

## 2015-04-01 MED FILL — MIDAZOLAM 1 MG/ML IJ SOLN: 1 mg/mL | INTRAMUSCULAR | Qty: 2

## 2015-04-01 MED FILL — EPINEPHRINE 0.1 MG/ML SYRINGE: 0.1 mg/mL | INTRAMUSCULAR | Qty: 10

## 2015-04-01 MED FILL — LIDOCAINE (PF) 20 MG/ML (2 %) IJ SOLN: 20 mg/mL (2 %) | INTRAMUSCULAR | Qty: 5

## 2015-04-01 MED FILL — PHENYLEPHRINE IN 0.9 % SODIUM CL (40 MCG/ML) IV SYRINGE: 0.4 mg/10 mL (40 mcg/mL) | INTRAVENOUS | Qty: 10

## 2015-04-01 MED FILL — CONRAY 60 % INJECTION SOLUTION: 60 % | INTRAMUSCULAR | Qty: 50

## 2015-04-01 MED FILL — FENTANYL CITRATE (PF) 50 MCG/ML IJ SOLN: 50 mcg/mL | INTRAMUSCULAR | Qty: 2

## 2015-04-01 MED FILL — PROTONIX 40 MG TABLET,DELAYED RELEASE: 40 mg | ORAL | Qty: 1

## 2015-04-01 MED FILL — HEALTHYLAX 17 GRAM ORAL POWDER PACKET: 17 gram | ORAL | Qty: 1

## 2015-04-01 NOTE — Procedures (Signed)
[facility]    NAME:  Diane [name]   DOB:   1986-07-17   MRN:   [id number]     Date/Time:  04/01/2015 2:35 PM    Procedure Type:   ERCP with biliary sphincterotomy     Indications: common bile duct stone  Suspected on intraoperative cholangiogram   Pre-operative Diagnosis: see indication above  Post-operative Diagnosis:  See findings below  Operator: Herbert MoorsPaul Frederick Duckworth Jr, MD, MD    Referring Provider:    Victorio PalmMichael Grillon, MD,     Exam:  Airway: clear, no airway problems anticipated  Heart: RRR, without gallops or rubs  Lungs: clear bilaterally without wheezes, crackles, or rhonchi  Abdomen: soft, nontender, nondistended, bowel sounds present  Mental Status: awake, alert and oriented to person, place and time    Sedation:  General anesthesia  Procedure Details:  After informed consent was obtained with all risks and benefits of procedure explained, the patient was taken to the fluoroscopy suite and placed in the prone position.  Upon sequential sedation as per above, the Olympus duodenoscope  was inserted via the mouthpeice and carefully advanced to the second portion of the duodenum.   The quality of visualization was good.  The duodenoscope was withdrawn into a short position.      Findings:   Endoscopic: -normal esophagus, stomach, and duodenum  Ampulla:-normal   Cholangiogram: -Normal appearing cbd, chd and intrahepatic bile ducts. No filling defect seen. Cystic duct stump intact  Pancreatogram:not performed    Specimen Removed:  None    Complications: None.     EBL:  None.    Interventions:    Pancreatic: none  Biliary: Because of the elevated liver enzymes and IOC proceeded with biliary sphincterotomy and balloon sweep of bile ducts. No stone found. Transient post sphincterotomy bleeding stopped with injection of 1:10,000 epi, 2 cc    Impression:  -No cbd stone found. Biliary sphincterotomy performed,      Recommendations:  Clear liquid diet and advance as tolerated.  Resume normal medication(s).     Herbert MoorsPaul Frederick Duckworth Jr, MD

## 2015-04-01 NOTE — Other (Signed)
TRANSFER - OUT REPORT:    Verbal report given to [facility] CenterMeghan RN on Diane [name]  being transferred to 2182(unit) for routine post - op       Report consisted of patient???s Situation, Background, Assessment and   Recommendations(SBAR).     Information from the following report(s) SBAR, OR Summary, Procedure Summary, Intake/Output and MAR was reviewed with the receiving nurse.    Opportunity for questions and clarification was provided.      Patient transported with:   Registered Nurse  Tech

## 2015-04-01 NOTE — Progress Notes (Signed)
Bedside and Verbal shift change report given to Charles, RN  (oncoming nurse) by Megan, RN (offgoing nurse). Report included the following information SBAR, Kardex, Intake/Output and MAR.

## 2015-04-01 NOTE — Other (Signed)
Handoff Report from Operating Room to PACU    Report received from [facility] RN and [facility] CRNA & Arletta BaleKristen SRNA regarding Diane [name].      Surgeon(s):  Erenest RasherPaul Frederick Wyvonnia Duskyuckworth Jr., MD  And Procedure(s) (LRB):  ENDOSCOPIC RETROGRADE CHOLANGIOPANCREATOGRAPHY, SPHINCTEROTOMY (N/A)  confirmed   with allergies, drains and dressings discussed.    Anesthesia type, drugs, patient history, complications, estimated blood loss, vital signs, intake and output, and last pain medication were reviewed.

## 2015-04-01 NOTE — Op Note (Signed)
See procedure note

## 2015-04-01 NOTE — Other (Signed)
04/01/15 1045 patient complaints of pain 10/10 with no abnormalities to the surgical site called and informed Dr. Lucretia RoersWood no orders received but to just reassure patient and to work on comforting patient.  1050 patient informed and was worked with and was noncompliant with non-medicating means with this nurse and did not want further intervention at this time.  1130 patient found position for her that was comfortable and pain is lessened patient fine with waiting for next dose of pain medication.      End of Shift Nursing Note    Bedside shift change report given to Natalia LeatherwoodKatherine RN (oncoming nurse) by Joanie Coddingtonharles RN (offgoing nurse). Report included the following information SBAR, Kardex and MAR.    Opportunity for questions and clarification was given to oncoming nurse. Patient bed is in low position, side rails are up x 2, door & observation blinds open as needed, call bell within reach and patient not in distress.    Lynetta Mareharles J Fizer, RN

## 2015-04-01 NOTE — Anesthesia Pre-Procedure Evaluation (Addendum)
Anesthetic History   No history of anesthetic complications            Review of Systems / Medical History  Patient summary reviewed, nursing notes reviewed and pertinent labs reviewed    Pulmonary  Within defined limits                 Neuro/Psych   Within defined limits           Cardiovascular  Within defined limits                Exercise tolerance: >4 METS     GI/Hepatic/Renal               Comments: Common bile duct obstruction  Cholelithiasis  LFT elevation Endo/Other  Within defined limits           Other Findings              Physical Exam    Airway  Mallampati: I  TM Distance: > 6 cm  Neck ROM: normal range of motion   Mouth opening: Normal     Cardiovascular  Regular rate and rhythm,  S1 and S2 normal,  no murmur, click, rub, or gallop             Dental  No notable dental hx       Pulmonary  Breath sounds clear to auscultation               Abdominal  GI exam deferred       Other Findings            Anesthetic Plan    ASA: 2  Anesthesia type: general          Induction: Intravenous  Anesthetic plan and risks discussed with: Patient

## 2015-04-01 NOTE — Progress Notes (Signed)
Admit Date: 03/26/2015    POD 4 Days Post-Op    Procedure:  Procedure(s):  CHOLECYSTECTOMY LAPAROSCOPIC WITH GRAMS    Subjective:     Patient has no complaints.     Objective:     Blood pressure 115/80, pulse 82, temperature 98.2 ??F (36.8 ??C), resp. rate 16, height 5' 6"  (1.676 m), weight 179 lb (81.194 kg), last menstrual period 03/24/2015, SpO2 97 %.    Temp (24hrs), Avg:98.1 ??F (36.7 ??C), Min:97.8 ??F (36.6 ??C), Max:98.6 ??F (37 ??C)      Physical Exam:  GENERAL: alert, cooperative, no distress, appears stated age, LUNG: clear to auscultation bilaterally, HEART: regular rate and rhythm, S1, S2 normal, no murmur, click, rub or gallop, ABDOMEN: soft, non-tender. Bowel sounds normal. No masses,  no organomegaly, wounds c/d/i, EXTREMITIES:  extremities normal, atraumatic, no cyanosis or edema    Labs:   Recent Results (from the past 24 hour(s))   CBC W/O DIFF    Collection Time: 04/01/15  4:21 AM   Result Value Ref Range    WBC 6.0 3.6 - 11.0 K/uL    RBC 3.83 3.80 - 5.20 M/uL    HGB 11.5 11.5 - 16.0 g/dL    HCT 35.0 35.0 - 47.0 %    MCV 91.4 80.0 - 99.0 FL    MCH 30.0 26.0 - 34.0 PG    MCHC 32.9 30.0 - 36.5 g/dL    RDW 13.3 11.5 - 14.5 %    PLATELET 265 150 - 161 K/uL   METABOLIC PANEL, COMPREHENSIVE    Collection Time: 04/01/15  4:21 AM   Result Value Ref Range    Sodium 141 136 - 145 mmol/L    Potassium 4.9 3.5 - 5.1 mmol/L    Chloride 107 97 - 108 mmol/L    CO2 30 21 - 32 mmol/L    Anion gap 4 (L) 5 - 15 mmol/L    Glucose 92 65 - 100 mg/dL    BUN 3 (L) 6 - 20 MG/DL    Creatinine 0.77 0.55 - 1.02 MG/DL    BUN/Creatinine ratio 4 (L) 12 - 20      GFR est AA >60 >60 ml/min/1.46m    GFR est non-AA >60 >60 ml/min/1.762m   Calcium 8.7 8.5 - 10.1 MG/DL    Bilirubin, total 0.9 0.2 - 1.0 MG/DL    ALT 269 (H) 12 - 78 U/L    AST 128 (H) 15 - 37 U/L    Alk. phosphatase 159 (H) 45 - 117 U/L    Protein, total 6.4 6.4 - 8.2 g/dL    Albumin 3.1 (L) 3.5 - 5.0 g/dL    Globulin 3.3 2.0 - 4.0 g/dL    A-G Ratio 0.9 (L) 1.1 - 2.2      LIPASE    Collection Time: 04/01/15  4:21 AM   Result Value Ref Range    Lipase 103 73 - 393 U/L       Data Review images and reports reviewed    Assessment:     Active Problems:    Cholelithiasis (03/26/2015)      LFT elevation (03/26/2015)      Common bile duct (CBD) obstruction (03/27/2015)        Plan/Recommendations/Medical Decision Making:     Continue present treatment   For ERCP this afternoon  Likely home tomorrow.    MaCeline AhrWoClydene LamingMD, [facility]

## 2015-04-01 NOTE — Other (Signed)
TRANSFER - IN REPORT:    Verbal report received from Megan, RN(name) on Diane Nelson  being received from 2182(unit) for ordered procedure      Report consisted of patient???s Situation, Background, Assessment and   Recommendations(SBAR).     Information from the following report(s) SBAR, Kardex, Procedure Summary, Intake/Output and MAR was reviewed with the receiving nurse.    Opportunity for questions and clarification was provided.

## 2015-04-01 NOTE — Procedures (Signed)
[facility]    NAME:  Diane Nelson   DOB:   January 14, 1986   MRN:   [id number]     Date/Time:  04/01/2015 2:35 PM    Procedure Type:   ERCP with biliary sphincterotomy     Indications: common bile duct stone  Suspected on intraoperative cholangiogram   Pre-operative Diagnosis: see indication above  Post-operative Diagnosis:  See findings below  Operator: Herbert MoorsPaul Frederick Duckworth Jr, MD, MD    Referring Provider:    Victorio PalmMichael Grillon, MD,     Exam:  Airway: clear, no airway problems anticipated  Heart: RRR, without gallops or rubs  Lungs: clear bilaterally without wheezes, crackles, or rhonchi  Abdomen: soft, nontender, nondistended, bowel sounds present  Mental Status: awake, alert and oriented to person, place and time    Sedation:  General anesthesia  Procedure Details:  After informed consent was obtained with all risks and benefits of procedure explained, the patient was taken to the fluoroscopy suite and placed in the prone position.  Upon sequential sedation as per above, the Olympus duodenoscope  was inserted via the mouthpeice and carefully advanced to the second portion of the duodenum.   The quality of visualization was good.  The duodenoscope was withdrawn into a short position.      Findings:   Endoscopic: -normal esophagus, stomach, and duodenum  Ampulla:-normal   Cholangiogram: -Normal appearing cbd, chd and intrahepatic bile ducts. No filling defect seen. Cystic duct stump intact  Pancreatogram:not performed    Specimen Removed:  None    Complications: None.     EBL:  None.    Interventions:    Pancreatic: none  Biliary: Because of the elevated liver enzymes and IOC proceeded with biliary sphincterotomy and balloon sweep of bile ducts. No stone found. Transient post sphincterotomy bleeding stopped with injection of 1:10,000 epi, 2 cc    Impression:  -No cbd stone found. Biliary sphincterotomy performed,     Recommendations:  Clear liquid diet and advance as tolerated.   Resume normal medication(s).     Herbert MoorsPaul Frederick Duckworth Jr, MD

## 2015-04-01 NOTE — Progress Notes (Signed)
Nutrition Assessment:    RECOMMENDATIONS:   Resume solid diet s/p ERCP as able per surgeon    ASSESSMENT:   Chart reviewed, pt going off the floor for ERCP at time of visit.  She is POD#4 lap chole.  She was on full liquids with a good appetite prior to being NPO for the procedure.  LFT's are trending downward.  BM noted today.  Likely plan for discharge tomorrow per progress notes.      Dietitians Intervention(s)/Plan(s): Resume diet s/p ERCP, monitor PO intake.    SUBJECTIVE/OBJECTIVE:   Pt off the floor   Diet Order: NPO (was on fulls)  % Eaten:  Patient Vitals for the past 72 hrs:   % Diet Eaten   04/01/15 0935 0 %   04/01/15 0730 0 %   03/31/15 1210 100 %   03/31/15 1010 100 %   03/30/15 1210 100 %   03/30/15 1006 50 %     Pertinent Medications:protonix, miralax; IVF(D5NS_0 /h).    Chemistries:  Lab Results   Component Value Date/Time    SODIUM 141 04/01/2015 04:21 AM    POTASSIUM 4.9 04/01/2015 04:21 AM    CHLORIDE 107 04/01/2015 04:21 AM    CO2 30 04/01/2015 04:21 AM    ANION GAP 4 04/01/2015 04:21 AM    GLUCOSE 92 04/01/2015 04:21 AM    BUN 3 04/01/2015 04:21 AM    CREATININE 0.77 04/01/2015 04:21 AM    BUN/CREATININE RATIO 4 04/01/2015 04:21 AM    GFR EST AA >60 04/01/2015 04:21 AM    GFR EST NON-AA >60 04/01/2015 04:21 AM    CALCIUM 8.7 04/01/2015 04:21 AM    ALBUMIN 3.1 04/01/2015 04:21 AM      Anthropometrics: Height: _1  (167.6 cm) Weight: 81.194 kg (179 lb)    IBW (%IBW):   ( ) UBW (%UBW):   (  %)    BMI: Body mass index is 28.91 kg/(m^2).    This BMI is indicative of:  _2 Underweight   _3 Normal   _4 Overweight   _5  Obesity   _6  Extreme Obesity (BMI>40)  Estimated Nutrition Needs (Based on): 1800 Kcals/day (BMR (1577) x 1.3AF -250kcal) , 66 g (0.8 g/kg bw) Protein  Carbohydrate: At Least 130 g/day  Fluids: 1800 mL/day    Last BM: 7/11   Active     Hyperactive  Hypoactive        Absent   BS  Skin:     Intact    Incision   Breakdown    DTI    Tears/Excoriation/Abrasion  Edema  Other:    Wt Readings from Last 30 Encounters:   03/28/15 81.194 kg (179 lb)      NUTRITION DIAGNOSES:   Problem:  Inadequate protein-energy intake Unintended weight loss    Etiology: related to current medical condition poor appetite    Signs/Symptoms: as evidenced by NPO status  reported 15# weight loss x 3 months (7.7%)   Previous dx re: inadequate intake resolving, when pt was on fulls her appetite was good.  Previous dx re: wt loss continues, no new wt available.     NUTRITION INTERVENTIONS:  Meals/Snacks: General/healthful diet                  GOAL:   Pt will consume >50% of solid diet in 3-5 days.     NUTRITION MONITORING AND EVALUATION   Previous Goal: Diet advanced next 1-3 days   Previous Goal Met: Yes   Previous Recommendations Implemented:  Yes   Cultural, Religious, or Ethnic Dietary Needs: None   LEARNING NEEDS (Diet, Food/Nutrient-Drug Interaction):     None Identified    Identified and Education Provided/Documented    Identified and Pt declined/was not appropriate       Interdisciplinary Care Plan Reviewed/Documented     Participated in Discharge Planning/Interdisciplinary Rounds    NUTRITION RISK:     High               Moderate             Low    Minimal/Uncompromised      Earney Mallet, RD  Pager [phone]  Weekend Pager [phone]

## 2015-04-01 NOTE — Other (Signed)
Dr. McDonough at bedside

## 2015-04-01 NOTE — Anesthesia Post-Procedure Evaluation (Signed)
Post-Anesthesia Evaluation and Assessment    Patient: Diane Nelson MRN: [id number]  SSN: [id number]    Date of Birth: [birth year]  Age: 29 y.o.  Sex: female       Cardiovascular Function/Vital Signs  Visit Vitals   Item Reading   ??? BP 112/80 mmHg   ??? Pulse 61   ??? Temp 36.6 ??C (97.9 ??F)   ??? Resp 12   ??? Ht 5\' 6"  (1.676 m)   ??? Wt 81.194 kg (179 lb)   ??? BMI 28.91 kg/m2   ??? SpO2 98%       Patient is status post general anesthesia for Procedure(s):  ENDOSCOPIC RETROGRADE CHOLANGIOPANCREATOGRAPHY, SPHINCTEROTOMY.    Nausea/Vomiting: None    Postoperative hydration reviewed and adequate.    Pain:  Pain Scale 1: Numeric (0 - 10) (04/01/15 1250)  Pain Intensity 1: 8 (04/01/15 1250)   Managed    Neurological Status:   Neuro (WDL): Within Defined Limits (04/01/15 1300)  Neuro  Neurologic State: Alert;Appropriate for age (04/01/15 1300)  Orientation Level: Appropriate for age;Oriented X4 (04/01/15 1300)  Cognition: Appropriate decision making;Appropriate for age attention/concentration;Appropriate safety awareness;Follows commands (04/01/15 1300)  Speech: Appropriate for age;Clear (04/01/15 1300)  LUE Motor Response: Purposeful (03/30/15 0910)  LLE Motor Response: Purposeful (03/30/15 0910)  RUE Motor Response: Purposeful (03/30/15 0910)  RLE Motor Response: Purposeful (03/30/15 0910)   At baseline    Mental Status and Level of Consciousness: Alert and oriented     Pulmonary Status:   O2 Device: Room air (04/01/15 1515)   Adequate oxygenation and airway patent    Complications related to anesthesia: None    Post-anesthesia assessment completed. No concerns    Signed By: Atilano InaONAN M Elmore Hyslop, MD     April 01, 2015

## 2015-04-01 NOTE — Other (Signed)
1429; Patient: Diane Nelson MRN: [id number]  SSN: [id number]   Date of Birth: 1986/03/14  Age: 29 y.o.  Sex: female     Patient is status post Procedure(s):  ENDOSCOPIC RETROGRADE CHOLANGIOPANCREATOGRAPHY, SPHINCTEROTOMY.    Surgeon(s) and Role:     * Herbert MoorsPaul Frederick Duckworth Jr., MD - Primary    Local/Dose/Irrigation:                    Peripheral IV 03/30/15 Left Hand (Active)   Site Assessment Clean, dry, & intact 04/01/2015  1:06 PM   Phlebitis Assessment 0 04/01/2015  1:06 PM   Infiltration Assessment 0 04/01/2015  1:06 PM   Dressing Status Clean, dry, & intact 04/01/2015  1:06 PM   Dressing Type Tape;Transparent 04/01/2015  1:06 PM   Hub Color/Line Status Blue;Infusing 04/01/2015  1:06 PM          Jackson-Pratt Drain 03/28/15 Abdomen (Active)   Site Assessment Clean, dry, & intact 03/31/2015  9:14 PM   Dressing Status Clean, dry, & intact 03/31/2015  9:14 PM   Status Patent;Charged;Draining 03/31/2015  9:14 PM   Drainage Color Serosanguinous 03/31/2015  9:14 PM   Output (ml) 20 ml 03/30/2015  3:54 PM                     Dressing/Packing:  Wound Abdomen-DRESSING TYPE: Topical skin adhesive/glue (04/01/15 0256)  Splint/Cast:  ]    Other:

## 2015-04-01 NOTE — Progress Notes (Signed)
GI PROGRESS NOTE    NAME:             Diane Nelson   DOB:              26-Feb-1986   MRN:              [id number]   Admit Date:     03/26/2015  Todays Date:  04/01/2015      Subjective:           Less pain, now more localized in the RUQ. Ready for ERCP today.    Medications-reviewed     Current Facility-Administered Medications   Medication Dose Route Frequency   ??? HYDROmorphone (PF) (DILAUDID) injection 2 mg  2 mg IntraVENous Q3H PRN   ??? pantoprazole (PROTONIX) tablet 40 mg  40 mg Oral ACB   ??? ondansetron (ZOFRAN) injection 4 mg  4 mg IntraVENous Q6H PRN   ??? sodium chloride (NS) flush 5-10 mL  5-10 mL IntraVENous Q8H   ??? sodium chloride (NS) flush 5-10 mL  5-10 mL IntraVENous PRN   ??? polyethylene glycol (MIRALAX) packet 17 g  17 g Oral DAILY   ??? dextrose 5% and 0.9% NaCl infusion  100 mL/hr IntraVENous CONTINUOUS        Objective:   Patient Vitals for the past 8 hrs:   BP Temp Pulse Resp SpO2   04/01/15 0727 106/60 mmHg 97.8 ??F (36.6 ??C) 65 18 97 %   04/01/15 0501 100/65 mmHg 98.3 ??F (36.8 ??C) 67 17 97 %   04/01/15 0128 124/75 mmHg - - - -   04/01/15 0052 105/73 mmHg - - - -        07/09 1901 - 07/11 0700  In: 6051.7 [P.O.:2400; I.V.:3651.7]  Out: -     EXAM:     NEURO-a&o   HEENT-wnl   LUNGS-clear   COR-regular rate and rhythym   ABD-soft , no tenderness, no rebound, good bs        LABS:  Recent Labs      04/01/15   0421   WBC  6.0   HGB  11.5   HCT  35.0   PLT  265     Recent Labs      04/01/15   0421  03/30/15   0953   NA  141  138   K  4.9  3.9   CL  107  105   CO2  30  25   BUN  3*  4*   CREA  0.77  0.70   GLU  92  98   CA  8.7  8.9     Recent Labs      04/01/15   0421  03/30/15   0953   SGOT  128*  194*   AP  159*  203*   TBILI  0.9  1.2*   TP  6.4  7.4   ALB  3.1*  3.4*   GLOB  3.3  4.0   LPSE  103   --    ALT  269*  369*                            Assessment:   1. Suspected CBD stone on IOC  2. Elevated LFT's---improved, so expect stone floating around  3. S/p lap chole           Plan:    1. ERCP with sphincterotomy at 2  pm

## 2015-04-01 NOTE — Progress Notes (Addendum)
0003: Paged Dr. Oletha CruelGrillon in re: patient's BP of 86/62    0008: Dr. Oletha CruelGrillon returned page and ordered 250 mL bolus of NS, will continue to monitor

## 2015-04-02 MED ORDER — SIMETHICONE 40 MG/0.6 ML ORAL DROPS, SUSP
40 mg/0.6 mL | ORAL | Status: DC | PRN
Start: 2015-04-02 — End: 2015-04-02

## 2015-04-02 MED ORDER — SODIUM CHLORIDE 0.9 % IV
INTRAVENOUS | Status: DC
Start: 2015-04-02 — End: 2015-04-02
  Administered 2015-04-02: 12:00:00 via INTRAVENOUS

## 2015-04-02 MED ORDER — NALOXONE 0.4 MG/ML INJECTION
0.4 mg/mL | INTRAMUSCULAR | Status: DC | PRN
Start: 2015-04-02 — End: 2015-04-02

## 2015-04-02 MED ORDER — HYDROMORPHONE 2 MG TAB
2 mg | ORAL_TABLET | ORAL | Status: DC | PRN
Start: 2015-04-02 — End: 2017-11-21

## 2015-04-02 MED ORDER — ATROPINE 0.1 MG/ML SYRINGE
0.1 mg/mL | Freq: Once | INTRAMUSCULAR | Status: DC | PRN
Start: 2015-04-02 — End: 2015-04-02

## 2015-04-02 MED ORDER — EPINEPHRINE 0.1 MG/ML SYRINGE
0.1 mg/mL | Freq: Once | INTRAMUSCULAR | Status: DC | PRN
Start: 2015-04-02 — End: 2015-04-02

## 2015-04-02 MED ORDER — FLUMAZENIL 0.1 MG/ML IV SOLN
0.1 mg/mL | INTRAVENOUS | Status: DC | PRN
Start: 2015-04-02 — End: 2015-04-02

## 2015-04-02 MED ORDER — SODIUM CHLORIDE 0.9 % IJ SYRG
INTRAMUSCULAR | Status: DC | PRN
Start: 2015-04-02 — End: 2015-04-02

## 2015-04-02 MED ORDER — SODIUM CHLORIDE 0.9 % IJ SYRG
Freq: Three times a day (TID) | INTRAMUSCULAR | Status: DC
Start: 2015-04-02 — End: 2015-04-02
  Administered 2015-04-02: 13:00:00 via INTRAVENOUS

## 2015-04-02 MED FILL — HYDROMORPHONE (PF) 1 MG/ML IJ SOLN: 1 mg/mL | INTRAMUSCULAR | Qty: 2

## 2015-04-02 MED FILL — BD POSIFLUSH NORMAL SALINE 0.9 % INJECTION SYRINGE: INTRAMUSCULAR | Qty: 20

## 2015-04-02 MED FILL — ONDANSETRON (PF) 4 MG/2 ML INJECTION: 4 mg/2 mL | INTRAMUSCULAR | Qty: 2

## 2015-04-02 MED FILL — BD POSIFLUSH NORMAL SALINE 0.9 % INJECTION SYRINGE: INTRAMUSCULAR | Qty: 10

## 2015-04-02 MED FILL — PROTONIX 40 MG TABLET,DELAYED RELEASE: 40 mg | ORAL | Qty: 1

## 2015-04-02 MED FILL — HEALTHYLAX 17 GRAM ORAL POWDER PACKET: 17 gram | ORAL | Qty: 1

## 2015-04-02 NOTE — Discharge Summary (Signed)
Physician Discharge Summary     Patient ID:  Diane Nelson  [id number]  28 y.o.  1986-03-09    Admit Date: 03/26/2015    Discharge Date: 04/02/2015    Admission Diagnoses: Choledocholithiasis with acute cholecystitis with obstructi*    Discharge Diagnoses:  Active Problems:    Cholelithiasis (03/26/2015)      LFT elevation (03/26/2015)      Common bile duct (CBD) obstruction (03/27/2015)         Admission Condition: Poor    Discharge Condition: Good    Last Procedure: Procedure(s):  ENDOSCOPIC RETROGRADE CHOLANGIOPANCREATOGRAPHY, SPHINCTEROTOMY      Hospital Course:   Normal hospital course for this procedure.  S/p lap cholecystectomy with IOC showing distal obstruction, and subsequent ERCP.    Consults: Gastroenterology and General Surgery    Disposition: home    Patient Instructions:   Current Discharge Medication List      START taking these medications    Details   HYDROmorphone (DILAUDID) 2 mg tablet Take 1 Tab by mouth every four (4) hours as needed for Pain. Max Daily Amount: 12 mg.  Qty: 30 Tab, Refills: 0           Activity: No driving while on analgesics and No heavy lifting for 4 weeks  Diet: Low fat, Low cholesterol  Wound Care: Keep wound clean and dry    Follow-up with Dr. Oletha CruelGrillon in 1 week.  Follow-up tests/labs none    Signed:  Tomma LightningMARK D Luellen Howson, MD  [facility]  04/02/2015  9:23 AM

## 2015-04-02 NOTE — Progress Notes (Signed)
Pt is being discharged to home today, her sister will be transporting her home at time of discharge.  Pt has a follow up appointment made with Dr. Oletha CruelGrillon, AVS updated.  CM noted pt does not have a PCP, CM provided pt a list of [place] PCP's and encouraged her to get established with one, she verbalized and agreed.      Second IM letter signed and copy given to pt.    No other discharge needs at this time.    Care Management Interventions  PCP Verified by CM: No (no pcp -[place]  Palliative Care Consult: No  Mode of Transport at Discharge: Other (see comment) (private vehicle)  Transition of Care Consult (CM Consult): Discharge Planning  Discharge Durable Medical Equipment: No  Physical Therapy Consult: No  Occupational Therapy Consult: No  Current Support Network: Relative's Home (lives with sister)  Confirm Follow Up Transport: Family  Plan discussed with Pt/Family/Caregiver: Yes  Freedom of Choice Offered: Yes  Discharge Location  Discharge Placement: Home    PAMELA Deno LungerJ GEORGE, RN  Case manager  Ext [phone]

## 2015-04-02 NOTE — Progress Notes (Signed)
Reviewed discharge instructions with patient who acknowledged understanding.  Gave her prescription.  Removed JP drain and IV and discharge in care of sister by car to home.

## 2015-04-03 MED FILL — ROCURONIUM 10 MG/ML IV: 10 mg/mL | INTRAVENOUS | Qty: 10

## 2015-04-03 MED FILL — QUELICIN 20 MG/ML INJECTION SOLUTION: 20 mg/mL | INTRAMUSCULAR | Qty: 120

## 2015-04-03 MED FILL — LIDOCAINE (PF) 20 MG/ML (2 %) IJ SOLN: 20 mg/mL (2 %) | INTRAMUSCULAR | Qty: 40

## 2015-04-03 MED FILL — ONDANSETRON (PF) 4 MG/2 ML INJECTION: 4 mg/2 mL | INTRAMUSCULAR | Qty: 4

## 2015-04-03 MED FILL — DIPRIVAN 10 MG/ML INTRAVENOUS EMULSION: 10 mg/mL | INTRAVENOUS | Qty: 150

## 2015-04-03 MED FILL — LACTATED RINGERS IV: INTRAVENOUS | Qty: 1000

## 2015-04-03 MED FILL — DEXAMETHASONE SODIUM PHOSPHATE 4 MG/ML IJ SOLN: 4 mg/mL | INTRAMUSCULAR | Qty: 8

## 2015-04-11 ENCOUNTER — Encounter: Attending: Family Medicine

## 2017-11-21 ENCOUNTER — Emergency Department: Admit: 2017-11-21 | Payer: Self-pay

## 2017-11-21 ENCOUNTER — Inpatient Hospital Stay
Admit: 2017-11-21 | Discharge: 2017-11-22 | Disposition: A | Discharge status: Home / Self Care | Payer: Self-pay | Attending: Emergency Medicine

## 2017-11-21 DIAGNOSIS — J219 Acute bronchiolitis, unspecified: Secondary | ICD-10-CM

## 2017-11-21 LAB — INFLUENZA A & B AG (RAPID TEST)
Influenza A Antigen: NEGATIVE
Influenza B Antigen: NEGATIVE

## 2017-11-21 MED ORDER — PREDNISONE 10 MG TABLETS IN A DOSE PACK
10 mg | ORAL_TABLET | ORAL | 0 refills | Status: AC
Start: 2017-11-21 — End: 2017-11-27

## 2017-11-21 MED ORDER — BENZONATATE 100 MG CAP
100 mg | ORAL_CAPSULE | Freq: Three times a day (TID) | ORAL | 0 refills | Status: AC | PRN
Start: 2017-11-21 — End: 2017-11-28

## 2017-11-21 NOTE — ED Provider Notes (Addendum)
EMERGENCY DEPARTMENT HISTORY AND PHYSICAL EXAM      Date: 11/21/2017  Patient Name: Diane Nelson    History of Presenting Illness     Chief Complaint   Patient presents with   ??? Cough     c/o productive blood tinged cough x two days with associated rib pain; also c/o headache and bilateral ear pain       History Provided By: Patient    HPI: Diane Nelson, 32 y.o. female presents to the ED with URI complaints that started yesterday. She has facial pain, productive cough, chest congestion, headache, and bilateral ear pain. She states she was concerned because last night she had blood tinged sputum x 3.  She denies sick contacts. She did not receive a flu vaccination this season. She has taken cough drops without relief of the Sx.     There are no other complaints, changes, or physical findings at this time.    Social Hx: Tobacco (denies), EtOH (denies), Illicit drug use (denies)     PCP: None    No current facility-administered medications on file prior to encounter.      No current outpatient medications on file prior to encounter.       Past History     Past Medical History:  No past medical history on file.    Past Surgical History:  Past Surgical History:   Procedure Laterality Date   ??? ERCP,SPHINCTEROTOMY  04/01/2015        ??? HX CHOLECYSTECTOMY  03/26/2015    Lap CHole       Family History:  No family history on file.    Social History:  Social History     Tobacco Use   ??? Smoking status: Never Smoker   Substance Use Topics   ??? Alcohol use: Yes     Comment: past social drinker    ??? Drug use: Yes     Types: Prescription, OTC       Allergies:  No Known Allergies      Review of Systems   Review of Systems   Constitutional: Negative for activity change, appetite change, chills, diaphoresis, fever and unexpected weight change.   HENT: Positive for congestion, ear pain and rhinorrhea. Negative for sore throat.    Respiratory: Positive for cough. Negative for shortness of breath.     Cardiovascular: Negative for chest pain.   Gastrointestinal: Negative for abdominal pain, constipation, diarrhea, nausea and vomiting.   Genitourinary: Negative for difficulty urinating, dysuria, frequency and hematuria.   Musculoskeletal: Negative for arthralgias and myalgias.   Neurological: Positive for headaches.   All other systems reviewed and are negative.      Physical Exam   Physical Exam   Constitutional: She is oriented to person, place, and time. She appears well-developed and well-nourished. No distress.   Overweight 31 y.o. Caucasian female    HENT:   Head: Normocephalic and atraumatic.   Right Ear: Tympanic membrane, external ear and ear canal normal. No middle ear effusion.   Left Ear: Tympanic membrane, external ear and ear canal normal.  No middle ear effusion.   Nose: Nose normal. No rhinorrhea. Right sinus exhibits no maxillary sinus tenderness and no frontal sinus tenderness. Left sinus exhibits no maxillary sinus tenderness and no frontal sinus tenderness.   Mouth/Throat: Uvula is midline and oropharynx is clear and moist. No oropharyngeal exudate, posterior oropharyngeal edema or posterior oropharyngeal erythema.   Eyes: Conjunctivae are normal. Right eye exhibits no discharge. Left  eye exhibits no discharge.   Neck: Normal range of motion. Neck supple.   Cardiovascular: Normal rate, regular rhythm and normal heart sounds.   No murmur heard.  Pulmonary/Chest: Effort normal and breath sounds normal. No respiratory distress.   No coughing throughout encounter.    Neurological: She is alert and oriented to person, place, and time.   Skin: Skin is warm and dry. She is not diaphoretic.   Psychiatric: She has a normal mood and affect. Her behavior is normal.   Nursing note and vitals reviewed.      Diagnostic Study Results     Labs -     Recent Results (from the past 12 hour(s))   INFLUENZA A & B AG (RAPID TEST)    Collection Time: 11/21/17  6:15 PM   Result Value Ref Range     Influenza A Antigen NEGATIVE  NEG      Influenza B Antigen NEGATIVE  NEG         Radiologic Studies -   CXR Results  (Last 48 hours)               11/21/17 1719  XR CHEST PA LAT Final result    Impression:  IMPRESSION: Findings compatible with bronchitis or asthma. No infiltrate       Narrative:  EXAM: XR CHEST PA LAT       INDICATION: productive cough with blood tinged       COMPARISON: None.       FINDINGS: PA and lateral radiographs of the chest demonstrate coarsening of the   interstitial markings at both lung bases. The cardiac and mediastinal contours   and pulmonary vascularity are normal. The bones and soft tissues are within   normal limits.                    Medical Decision Making   I am the first provider for this patient.    I reviewed the vital signs, available nursing notes, past medical history, past surgical history, family history and social history.    Vital Signs-Reviewed the patient's vital signs.  Patient Vitals for the past 12 hrs:   Temp Pulse Resp BP SpO2   11/21/17 1636 97.9 ??F (36.6 ??C) 78 18 120/74 100 %     Records Reviewed: Nursing Notes    Provider Notes (Medical Decision Making):   Bronchitis, PNA, viral illness, influenza,     ED Course:   Initial assessment performed. The patients presenting problems have been discussed, and they are in agreement with the care plan formulated and outlined with them.  I have encouraged them to ask questions as they arise throughout their visit.         Critical Care Time:   None    Disposition:  DISCHARGE NOTE:  6:59 PM  The pt is ready for discharge. The pt's signs, symptoms, diagnosis, and discharge instructions have been discussed and pt has conveyed their understanding. The pt is to follow up as recommended or return to ER should their symptoms worsen. Plan has been discussed and pt is in agreement.     PLAN:  1.   Current Discharge Medication List      START taking these medications    Details    benzonatate (TESSALON PERLES) 100 mg capsule Take 1 Cap by mouth three (3) times daily as needed for Cough for up to 7 days.  Qty: 30 Cap, Refills: 0      predniSONE (STERAPRED  DS) 10 mg dose pack Standard 6 day taper  Qty: 21 Tab, Refills: 0           2.   Follow-up Information     Follow up With Specialties Details Why Contact Info    Your PCP  In 1 week Please use the attached this list of facilities to locate one to meet your non-emergent medical needs         Return to ED if worse     Diagnosis     Clinical Impression:   1. Acute bronchiolitis due to unspecified organism          This note will not be viewable in MyChart.    7:35 AM  I was personally available for consultation in the emergency department.  I have reviewed the chart and agree with the documentation recorded by the [facility], including the assessment, treatment plan, and disposition.  Marlowe Shores, MD

## 2018-05-04 ENCOUNTER — Inpatient Hospital Stay
Admit: 2018-05-04 | Discharge: 2018-05-04 | Disposition: A | Discharge status: Home / Self Care | Payer: Self-pay | Attending: Emergency Medicine

## 2018-05-04 DIAGNOSIS — N3000 Acute cystitis without hematuria: Secondary | ICD-10-CM

## 2018-05-04 LAB — URINALYSIS W/ REFLEX CULTURE
Bilirubin, Urine: NEGATIVE
Bilirubin: NEGATIVE
Blood, Urine: NEGATIVE
Blood: NEGATIVE
Glucose, Ur: NEGATIVE mg/dL
Glucose: NEGATIVE mg/dL
Ketone: NEGATIVE mg/dL
Ketones, Urine: NEGATIVE mg/dL
Nitrite, Urine: NEGATIVE
Nitrites: NEGATIVE
Protein, UA: NEGATIVE mg/dL
Protein: NEGATIVE mg/dL
Specific Gravity, UA: 1.019 (ref 1.003–1.030)
Specific gravity: 1.019 (ref 1.003–1.030)
Urobilinogen, UA, POCT: 1 EU/dL (ref 0.2–1.0)
Urobilinogen: 1 EU/dL (ref 0.2–1.0)
pH (UA): 6.5 (ref 5.0–8.0)
pH, UA: 6.5 (ref 5.0–8.0)

## 2018-05-04 LAB — CBC W/O DIFF
ABSOLUTE NRBC: 0 10*3/uL (ref 0.00–0.01)
HCT: 42 % (ref 35.0–47.0)
HGB: 13.4 g/dL (ref 11.5–16.0)
MCH: 27.5 PG (ref 26.0–34.0)
MCHC: 31.9 g/dL (ref 30.0–36.5)
MCV: 86.1 FL (ref 80.0–99.0)
MPV: 9.3 FL (ref 8.9–12.9)
NRBC: 0 PER 100 WBC
PLATELET: 423 10*3/uL — ABNORMAL HIGH (ref 150–400)
RBC: 4.88 M/uL (ref 3.80–5.20)
RDW: 12.9 % (ref 11.5–14.5)
WBC: 7.8 10*3/uL (ref 3.6–11.0)

## 2018-05-04 LAB — METABOLIC PANEL, COMPREHENSIVE
A-G Ratio: 0.7 — ABNORMAL LOW (ref 1.1–2.2)
ALT (SGPT): 33 U/L (ref 12–78)
AST (SGOT): 24 U/L (ref 15–37)
Albumin: 3.5 g/dL (ref 3.5–5.0)
Alk. phosphatase: 128 U/L — ABNORMAL HIGH (ref 45–117)
Anion gap: 5 mmol/L (ref 5–15)
BUN/Creatinine ratio: 15 (ref 12–20)
BUN: 13 MG/DL (ref 6–20)
Bilirubin, total: 0.6 MG/DL (ref 0.2–1.0)
CO2: 29 mmol/L (ref 21–32)
Calcium: 8.9 MG/DL (ref 8.5–10.1)
Chloride: 104 mmol/L (ref 97–108)
Creatinine: 0.87 MG/DL (ref 0.55–1.02)
GFR est AA: >60 mL/min/{1.73_m2} (ref >60)
GFR est non-AA: >60 mL/min/{1.73_m2} (ref >60)
Globulin: 5.1 g/dL — ABNORMAL HIGH (ref 2.0–4.0)
Glucose: 85 mg/dL (ref 65–100)
Potassium: 4 mmol/L (ref 3.5–5.1)
Protein, total: 8.6 g/dL — ABNORMAL HIGH (ref 6.4–8.2)
Sodium: 138 mmol/L (ref 136–145)

## 2018-05-04 LAB — LIPASE
Lipase: 92 U/L (ref 73–393)
Lipase: 92 U/L (ref 73–393)

## 2018-05-04 LAB — HCG URINE, QL. - POC
HCG, Pregnancy, Urine, POC: NEGATIVE
Pregnancy test,urine (POC): NEGATIVE

## 2018-05-04 LAB — COMPREHENSIVE METABOLIC PANEL
ALT: 33 U/L (ref 12–78)
AST: 24 U/L (ref 15–37)
Albumin/Globulin Ratio: 0.7 — ABNORMAL LOW (ref 1.1–2.2)
Albumin: 3.5 g/dL (ref 3.5–5.0)
Alkaline Phosphatase: 128 U/L — ABNORMAL HIGH (ref 45–117)
Anion Gap: 5 mmol/L (ref 5–15)
BUN: 13 MG/DL (ref 6–20)
Bun/Cre Ratio: 15 (ref 12–20)
CO2: 29 mmol/L (ref 21–32)
Calcium: 8.9 MG/DL (ref 8.5–10.1)
Chloride: 104 mmol/L (ref 97–108)
Creatinine: 0.87 MG/DL (ref 0.55–1.02)
EGFR IF NonAfrican American: >60 mL/min/{1.73_m2} (ref >60)
GFR African American: >60 mL/min/{1.73_m2} (ref >60)
Globulin: 5.1 g/dL — ABNORMAL HIGH (ref 2.0–4.0)
Glucose: 85 mg/dL (ref 65–100)
Potassium: 4 mmol/L (ref 3.5–5.1)
Sodium: 138 mmol/L (ref 136–145)
Total Bilirubin: 0.6 MG/DL (ref 0.2–1.0)
Total Protein: 8.6 g/dL — ABNORMAL HIGH (ref 6.4–8.2)

## 2018-05-04 LAB — CBC
Hematocrit: 42 % (ref 35.0–47.0)
Hemoglobin: 13.4 g/dL (ref 11.5–16.0)
MCH: 27.5 PG (ref 26.0–34.0)
MCHC: 31.9 g/dL (ref 30.0–36.5)
MCV: 86.1 FL (ref 80.0–99.0)
MPV: 9.3 FL (ref 8.9–12.9)
NRBC Absolute: 0 10*3/uL (ref 0.00–0.01)
Nucleated RBCs: 0 PER 100 WBC
Platelets: 423 10*3/uL — ABNORMAL HIGH (ref 150–400)
RBC: 4.88 M/uL (ref 3.80–5.20)
RDW: 12.9 % (ref 11.5–14.5)
WBC: 7.8 10*3/uL (ref 3.6–11.0)

## 2018-05-04 MED ORDER — NITROFURANTOIN (25% MACROCRYSTAL FORM) 100 MG CAP
100 mg | ORAL_CAPSULE | Freq: Two times a day (BID) | ORAL | 0 refills | Status: AC
Start: 2018-05-04 — End: 2018-05-09

## 2018-05-04 MED ORDER — DICYCLOMINE 20 MG TAB
20 mg | ORAL_TABLET | Freq: Four times a day (QID) | ORAL | 0 refills | Status: AC
Start: 2018-05-04 — End: 2018-05-09

## 2018-05-04 NOTE — Other (Signed)
ACI reviewed with patient by provider. No further questions at time of discharge. DC'd ambulatory with family.

## 2018-05-04 NOTE — ED Provider Notes (Signed)
EMERGENCY DEPARTMENT HISTORY AND PHYSICAL EXAM      Date: 05/04/2018  Patient Name: Diane Nelson    History of Presenting Illness     Chief Complaint   Patient presents with   ??? Abdominal Pain     x2 days lower medial pain. reports having a positive pregnancy test. last month reports spotting but has irregular periods. denies N/V/D       History Provided By: Patient    HPI: Diane Nelson, 32 y.o. female without significant past medical history, presents ambulatory to the ED with cc of lower abdominal cramping and tightness for about the last 4 to 5 days.  Patient states that she took a pregnancy test last Thursday and it was positive but then she took another pregnancy test on Friday and it was negative so she is overall not sure if she is pregnant.  She states that her last menstrual period was in July but she is unable to provide a date as her periods are usually irregular.  She states that her pain comes and goes but she is unsure causes her pain.  It is not related to eating or pressure.  She denies any hematuria, flank pain, nausea, vomiting, diarrhea, constipation, fever, chills, chest pain, shortness of breath.      PCP: None    There are no other complaints, changes, or physical findings at this time.    Current Outpatient Medications   Medication Sig Dispense Refill   ??? nitrofurantoin, macrocrystal-monohydrate, (MACROBID) 100 mg capsule Take 1 Cap by mouth two (2) times a day for 5 days. 10 Cap 0   ??? dicyclomine (BENTYL) 20 mg tablet Take 1 Tab by mouth every six (6) hours for 20 doses. 20 Tab 0       Past History     Past Medical History:  No past medical history on file.    Past Surgical History:  Past Surgical History:   Procedure Laterality Date   ??? HX CHOLECYSTECTOMY  03/26/2015    Lap CHole   ??? PR ERCP W/SPHINCTEROTOMY/PAPILLOTOMY  04/01/2015            Family History:  No family history on file.    Social History:  Social History     Tobacco Use   ??? Smoking status: Never Smoker    Substance Use Topics   ??? Alcohol use: Yes     Comment: past social drinker    ??? Drug use: Yes     Types: Prescription, OTC       Allergies:  No Known Allergies      Review of Systems   Review of Systems   Constitutional: Negative.  Negative for activity change, appetite change, chills and fever.   HENT: Negative for rhinorrhea and sore throat.    Eyes: Negative for pain and visual disturbance.   Respiratory: Negative for cough and shortness of breath.    Cardiovascular: Negative for chest pain and palpitations.   Gastrointestinal: Positive for abdominal pain ("tightness, cramping"). Negative for diarrhea, nausea and vomiting.   Genitourinary: Positive for frequency. Negative for dysuria and hematuria.   Musculoskeletal: Negative for arthralgias and myalgias.   Skin: Negative for color change, rash and wound.   Neurological: Negative for dizziness, numbness and headaches.   All other systems reviewed and are negative.      Physical Exam   Physical Exam   Constitutional: She is oriented to person, place, and time. Vital signs are normal. She appears well-developed  and well-nourished. No distress.   32 y.o. female in NAD  Communicates appropriately and in full sentences  Normal vital signs   HENT:   Head: Normocephalic and atraumatic.   Eyes: Pupils are equal, round, and reactive to light. Conjunctivae are normal. Right eye exhibits no discharge. Left eye exhibits no discharge.   Neck: Normal range of motion. Neck supple.   No nuchal rigidity   Cardiovascular: Normal rate, regular rhythm and intact distal pulses.   Pulmonary/Chest: Effort normal and breath sounds normal. No respiratory distress. She has no wheezes.   Abdominal: Soft. Bowel sounds are normal. She exhibits no distension. There is no tenderness. There is no rebound and no guarding.   Benign abdominal exam.   Musculoskeletal: Normal range of motion. She exhibits no edema, tenderness or deformity.   No neurologic or vascular compromise on exam.     Neurological: She is alert and oriented to person, place, and time. Coordination normal.   Skin: Skin is warm and dry. No rash noted. She is not diaphoretic. No erythema. No pallor.   Psychiatric: She has a normal mood and affect.   Nursing note and vitals reviewed.        Diagnostic Study Results     Labs -     Recent Results (from the past 12 hour(s))   URINALYSIS W/ REFLEX CULTURE    Collection Time: 05/04/18  5:52 PM   Result Value Ref Range    Color YELLOW/STRAW      Appearance CLOUDY (A) CLEAR      Specific gravity 1.019 1.003 - 1.030      pH (UA) 6.5 5.0 - 8.0      Protein NEGATIVE  NEG mg/dL    Glucose NEGATIVE  NEG mg/dL    Ketone NEGATIVE  NEG mg/dL    Bilirubin NEGATIVE  NEG      Blood NEGATIVE  NEG      Urobilinogen 1.0 0.2 - 1.0 EU/dL    Nitrites NEGATIVE  NEG      Leukocyte Esterase TRACE (A) NEG      WBC 5-10 0 - 4 /hpf    RBC 0-5 0 - 5 /hpf    Epithelial cells MODERATE (A) FEW /lpf    Bacteria 2+ (A) NEG /hpf    UA:UC IF INDICATED URINE CULTURE ORDERED (A) CNI      Hyaline cast 0-2 0 - 5 /lpf   CBC W/O DIFF    Collection Time: 05/04/18  5:52 PM   Result Value Ref Range    WBC 7.8 3.6 - 11.0 K/uL    RBC 4.88 3.80 - 5.20 M/uL    HGB 13.4 11.5 - 16.0 g/dL    HCT 42.0 35.0 - 47.0 %    MCV 86.1 80.0 - 99.0 FL    MCH 27.5 26.0 - 34.0 PG    MCHC 31.9 30.0 - 36.5 g/dL    RDW 12.9 11.5 - 14.5 %    PLATELET 423 (H) 150 - 400 K/uL    MPV 9.3 8.9 - 12.9 FL    NRBC 0.0 0 PER 100 WBC    ABSOLUTE NRBC 0.00 0.00 - 1.02 K/uL   METABOLIC PANEL, COMPREHENSIVE    Collection Time: 05/04/18  5:52 PM   Result Value Ref Range    Sodium 138 136 - 145 mmol/L    Potassium 4.0 3.5 - 5.1 mmol/L    Chloride 104 97 - 108 mmol/L    CO2 29 21 - 32 mmol/L  Anion gap 5 5 - 15 mmol/L    Glucose 85 65 - 100 mg/dL    BUN 13 6 - 20 MG/DL    Creatinine 0.87 0.55 - 1.02 MG/DL    BUN/Creatinine ratio 15 12 - 20      GFR est AA >60 >60 ml/min/1.69m    GFR est non-AA >60 >60 ml/min/1.754m   Calcium 8.9 8.5 - 10.1 MG/DL     Bilirubin, total 0.6 0.2 - 1.0 MG/DL    ALT (SGPT) 33 12 - 78 U/L    AST (SGOT) 24 15 - 37 U/L    Alk. phosphatase 128 (H) 45 - 117 U/L    Protein, total 8.6 (H) 6.4 - 8.2 g/dL    Albumin 3.5 3.5 - 5.0 g/dL    Globulin 5.1 (H) 2.0 - 4.0 g/dL    A-G Ratio 0.7 (L) 1.1 - 2.2     LIPASE    Collection Time: 05/04/18  5:52 PM   Result Value Ref Range    Lipase 92 73 - 393 U/L   HCG URINE, QL. - POC    Collection Time: 05/04/18  6:07 PM   Result Value Ref Range    Pregnancy test,urine (POC) NEGATIVE  NEG         Radiologic Studies -   No orders to display     CT Results  (Last 48 hours)    None        CXR Results  (Last 48 hours)    None            Medical Decision Making   I am the first provider for this patient.    I reviewed the vital signs, available nursing notes, past medical history, past surgical history, family history and social history.    Vital Signs-Reviewed the patient's vital signs.  Patient Vitals for the past 12 hrs:   Temp Pulse Resp BP SpO2   05/04/18 1800 ??? ??? ??? ??? 97 %   05/04/18 1733 97.7 ??F (36.5 ??C) 79 17 105/68 97 %         Records Reviewed: Nursing Notes and Old Medical Records    Provider Notes (Medical Decision Making):   DDx: UTI, dysmenorrhea, missed menses, imbalance, irregular periods, dehydration, electrolyte abnormality, pancreatitis.    Healthy [age]ear old female with no significant past medical history presents ambulatory to the emergency department with complaints of abdominal pain.  Patient initially attributed this to possibly being pregnant but has had a negative pregnancy test at home.  Will evaluate with labs, urinalysis, pregnancy test.  Patient has a benign abdominal exam.  Doubt acute intra-abdominal process.  Will perform serial abdominal exams and decide the need for acute imaging.      ED Course:   Initial assessment performed. The patients presenting problems have been discussed, and they are in agreement with the care plan formulated and  outlined with them.  I have encouraged them to ask questions as they arise throughout their visit.      DISCHARGE NOTE:  Diane Nelson's  results have been reviewed with her.  She has been counseled regarding her diagnosis.  She verbally conveys understanding and agreement of the signs, symptoms, diagnosis, treatment and prognosis and additionally agrees to follow up as recommended with Dr. None in 24 - 48 hours.  She also agrees with the care-plan and conveys that all of her questions have been answered.  I have also put together some discharge instructions for her  that include: 1) educational information regarding their diagnosis, 2) how to care for their diagnosis at home, as well a 3) list of reasons why they would want to return to the ED prior to their follow-up appointment, should their condition change. She and/or family's questions have been answered. I have encouraged them to see the official results in "My Chart" or to retrieve the specifics of their results from medical records.     PLAN:  1. Return precautions as discussed  2. Follow-up with providers as directed  3. Medications as prescribed    Return to ED if worse     Diagnosis     Clinical Impression:   1. Acute cystitis without hematuria        Discharge Medication List as of 05/04/2018  7:07 PM      START taking these medications    Details   nitrofurantoin, macrocrystal-monohydrate, (MACROBID) 100 mg capsule Take 1 Cap by mouth two (2) times a day for 5 days., Print, Disp-10 Cap, R-0             Follow-up Information     Follow up With Specialties Details Why Contact Info    [facility] EMERGENCY DEPT Emergency Medicine Go to If symptoms worsen [place]  [phone]              This note will not be viewable in [place].

## 2018-05-04 NOTE — ED Provider Notes (Signed)
ED Provider Notes by Wyatt Mage., PA at 05/04/18 1915                Author: Wyatt Mage., PA  Service: Emergency Medicine  Author Type: Physician Assistant       Filed: 05/05/18 0842  Date of Service: 05/04/18 1915  Status: Attested           Editor: Wyatt Mage., PA (Physician Assistant)  Cosigner: Katha Cabal I, MD at 05/10/18 [phone]          Attestation signed by Alex Gardener, MD at 05/10/18 0604          6:04 AM   I was personally available for consultation in the emergency department.  I have reviewed the chart and agree with the documentation recorded by the [facility], including the assessment, treatment plan, and disposition.   Alex Gardener, MD                                       EMERGENCY DEPARTMENT HISTORY AND PHYSICAL EXAM           Date: 05/04/2018   Patient Name: Diane Nelson        History of Presenting Illness          Chief Complaint       Patient presents with        ?  Abdominal Pain             x2 days lower medial pain. reports having a positive pregnancy test. last month reports spotting but has irregular periods. denies N/V/D           History Provided By: Patient      HPI: Diane Nelson , 32 y.o. female without significant  past medical history, presents ambulatory to the ED with cc of lower abdominal cramping and tightness for about the last 4 to 5 days.  Patient states that she took a pregnancy test last Thursday and it was positive but then she took another pregnancy  test on Friday and it was negative so she is overall not sure if she is pregnant.  She states that her last menstrual period was in July but she is unable to provide a date as her periods are usually irregular.  She states that her pain comes and goes  but she is unsure causes her pain.  It is not related to eating or pressure.  She denies any hematuria, flank pain, nausea, vomiting, diarrhea, constipation, fever, chills, chest pain, shortness of breath.         PCP: None       There are no other complaints, changes, or physical findings at this time.        Current Outpatient Medications          Medication  Sig  Dispense  Refill           ?  nitrofurantoin, macrocrystal-monohydrate, (MACROBID) 100 mg capsule  Take 1 Cap by mouth two (2) times a day for 5 days.  10 Cap  0           ?  dicyclomine (BENTYL) 20 mg tablet  Take 1 Tab by mouth every six (6) hours for 20 doses.  20 Tab  0             Past History  Past Medical History:   No past medical history on file.      Past Surgical History:     Past Surgical History:         Procedure  Laterality  Date          ?  HX CHOLECYSTECTOMY    03/26/2015          Lap CHole          ?  PR ERCP W/SPHINCTEROTOMY/PAPILLOTOMY    04/01/2015                      Family History:   No family history on file.      Social History:     Social History          Tobacco Use         ?  Smoking status:  Never Smoker       Substance Use Topics         ?  Alcohol use:  Yes             Comment: past social drinker          ?  Drug use:  Yes              Types:  Prescription, OTC           Allergies:   No Known Allergies           Review of Systems     Review of Systems    Constitutional: Negative.  Negative for activity change, appetite change, chills and fever.    HENT: Negative for rhinorrhea and sore throat.     Eyes: Negative for pain and visual disturbance.    Respiratory: Negative for cough and shortness of breath.     Cardiovascular: Negative for chest pain and palpitations.    Gastrointestinal: Positive for abdominal pain ("tightness, cramping") . Negative for diarrhea, nausea and vomiting.    Genitourinary: Positive for frequency. Negative for dysuria and hematuria.    Musculoskeletal: Negative for arthralgias and myalgias.    Skin: Negative for color change, rash and wound.    Neurological: Negative for dizziness, numbness and headaches.    All other systems reviewed and are negative.           Physical Exam     Physical Exam    Constitutional: She is  oriented to person, place, and time. Vital signs are normal. She appears well-developed and well-nourished. No distress.   32 y.o. female in NAD  Communicates appropriately  and in full sentences  Normal vital signs    HENT:    Head: Normocephalic and atraumatic.    Eyes: Pupils are equal, round, and reactive to light. Conjunctivae are normal. Right eye exhibits no discharge. Left eye exhibits no discharge.    Neck: Normal range of motion. Neck supple.   No nuchal rigidity    Cardiovascular: Normal rate, regular rhythm and intact distal pulses.    Pulmonary/Chest: Effort normal and breath sounds normal. No respiratory distress. She has no wheezes.    Abdominal: Soft. Bowel sounds are normal. She exhibits no distension. There is no tenderness. There is no rebound and no guarding.   Benign abdominal exam.     Musculoskeletal: Normal range of motion. She exhibits no edema, tenderness or deformity.   No neurologic or vascular compromise on exam.     Neurological: She is alert and oriented to person, place, and time. Coordination normal.  Skin: Skin is warm and dry. No rash noted. She is not diaphoretic. No erythema. No pallor.   Psychiatric: She has a normal  mood and affect.    Nursing note and vitals reviewed.              Diagnostic Study Results        Labs -         Recent Results (from the past 12 hour(s))     URINALYSIS W/ REFLEX CULTURE          Collection Time: 05/04/18  5:52 PM         Result  Value  Ref Range            Color  YELLOW/STRAW          Appearance  CLOUDY (A)  CLEAR         Specific gravity  1.019  1.003 - 1.030         pH (UA)  6.5  5.0 - 8.0         Protein  NEGATIVE   NEG mg/dL       Glucose  NEGATIVE   NEG mg/dL       Ketone  NEGATIVE   NEG mg/dL       Bilirubin  NEGATIVE   NEG         Blood  NEGATIVE   NEG         Urobilinogen  1.0  0.2 - 1.0 EU/dL       Nitrites  NEGATIVE   NEG         Leukocyte Esterase  TRACE (A)  NEG         WBC  5-10  0 - 4 /hpf       RBC  0-5  0 - 5 /hpf        Epithelial cells  MODERATE (A)  FEW /lpf       Bacteria  2+ (A)  NEG /hpf       UA:UC IF INDICATED  URINE CULTURE ORDERED (A)  CNI         Hyaline cast  0-2  0 - 5 /lpf       CBC W/O DIFF          Collection Time: 05/04/18  5:52 PM         Result  Value  Ref Range            WBC  7.8  3.6 - 11.0 K/uL       RBC  4.88  3.80 - 5.20 M/uL       HGB  13.4  11.5 - 16.0 g/dL       HCT  42.0  35.0 - 47.0 %       MCV  86.1  80.0 - 99.0 FL       MCH  27.5  26.0 - 34.0 PG       MCHC  31.9  30.0 - 36.5 g/dL       RDW  12.9  11.5 - 14.5 %       PLATELET  423 (H)  150 - 400 K/uL       MPV  9.3  8.9 - 12.9 FL       NRBC  0.0  0 PER 100 WBC       ABSOLUTE NRBC  0.00  0.00 - 0.01 K/uL       METABOLIC PANEL, COMPREHENSIVE  Collection Time: 05/04/18  5:52 PM         Result  Value  Ref Range            Sodium  138  136 - 145 mmol/L       Potassium  4.0  3.5 - 5.1 mmol/L       Chloride  104  97 - 108 mmol/L       CO2  29  21 - 32 mmol/L       Anion gap  5  5 - 15 mmol/L       Glucose  85  65 - 100 mg/dL       BUN  13  6 - 20 MG/DL       Creatinine  0.87  0.55 - 1.02 MG/DL       BUN/Creatinine ratio  15  12 - 20         GFR est AA  >60  >60 ml/min/1.40m       GFR est non-AA  >60  >60 ml/min/1.738m      Calcium  8.9  8.5 - 10.1 MG/DL       Bilirubin, total  0.6  0.2 - 1.0 MG/DL       ALT (SGPT)  33  12 - 78 U/L       AST (SGOT)  24  15 - 37 U/L       Alk. phosphatase  128 (H)  45 - 117 U/L       Protein, total  8.6 (H)  6.4 - 8.2 g/dL       Albumin  3.5  3.5 - 5.0 g/dL       Globulin  5.1 (H)  2.0 - 4.0 g/dL       A-G Ratio  0.7 (L)  1.1 - 2.2         LIPASE          Collection Time: 05/04/18  5:52 PM         Result  Value  Ref Range            Lipase  92  73 - 393 U/L       HCG URINE, QL. - POC          Collection Time: 05/04/18  6:07 PM         Result  Value  Ref Range            Pregnancy test,urine (POC)  NEGATIVE   NEG             Radiologic Studies -      No orders to display          CT Results  (Last 48 hours)           None                 CXR Results  (Last 48  hours)          None                       Medical Decision Making     I am the first provider for this patient.      I reviewed the vital signs, available nursing notes, past medical history, past surgical history, family history and social history.      Vital Signs-Reviewed the patient's vital signs.   Patient Vitals for the past 12 hrs:  Temp  Pulse  Resp  BP  SpO2            05/04/18 1800  --  --  --  --  97 %            05/04/18 1733  97.7 ??F (36.5 ??C)  79  17  105/68  97 %              Records Reviewed: Nursing Notes and Old Medical Records      Provider Notes (Medical Decision Making):    DDx: UTI, dysmenorrhea, missed menses, imbalance, irregular periods, dehydration, electrolyte abnormality, pancreatitis.      Healthy 32 year old female with no significant past medical history presents ambulatory to the emergency department with complaints of abdominal pain.  Patient initially attributed this to possibly being pregnant but has had a negative pregnancy test  at home.  Will evaluate with labs, urinalysis, pregnancy test.  Patient has a benign abdominal exam.  Doubt acute intra-abdominal process.  Will perform serial abdominal exams and decide the need for acute imaging.         ED Course:    Initial assessment performed. The patients presenting problems have been discussed, and they are in agreement with the care plan formulated and outlined with them.   I have encouraged them to ask questions as they arise throughout their visit.         DISCHARGE NOTE:   Diane Nelson's  results have been reviewed with her .  She has been counseled regarding her  diagnosis.  She verbally conveys understanding and agreement of the signs, symptoms, diagnosis, treatment and prognosis and additionally agrees  to follow up as recommended with Dr. None in 24 - 48 hours.  She  also agrees with the care-plan and conveys that all of her questions have been answered.  I have  also put together some discharge instructions  for her that include: 1) educational information regarding their diagnosis, 2) how to care for their diagnosis at home, as well a 3) list of  reasons why they would want to return to the ED prior to their follow-up appointment, should their condition change. She and/or family's questions  have been answered. I have encouraged them to see the official results in "My Chart" or to retrieve the specifics of their results from medical records.       PLAN:   1. Return precautions as discussed   2. Follow-up with providers as directed   3. Medications as prescribed      Return to ED if worse         Diagnosis        Clinical Impression:       1.  Acute cystitis without hematuria              Discharge Medication List as of 05/04/2018  7:07 PM              START taking these medications          Details        nitrofurantoin, macrocrystal-monohydrate, (MACROBID) 100 mg capsule  Take 1 Cap by mouth two (2) times a day for 5 days., Print, Disp-10 Cap, R-0                           Follow-up Information               Follow up With  Specialties  Details  Why  Contact Info              [facility]  Emergency Medicine  Go to  If symptoms worsen  [place]   [phone]                        This note will not be viewable in [facility].

## 2018-05-06 LAB — CULTURE, URINE
Colonies Counted: 75000
Colony Count: 75000

## 2018-05-21 ENCOUNTER — Emergency Department: Admit: 2018-05-21 | Payer: Self-pay

## 2018-05-21 ENCOUNTER — Inpatient Hospital Stay
Admit: 2018-05-21 | Discharge: 2018-05-21 | Disposition: A | Discharge status: Home / Self Care | Payer: Self-pay | Attending: Emergency Medicine

## 2018-05-21 DIAGNOSIS — S93401A Sprain of unspecified ligament of right ankle, initial encounter: Secondary | ICD-10-CM

## 2018-05-21 MED ORDER — IBUPROFEN 600 MG TAB
600 mg | ORAL_TABLET | Freq: Four times a day (QID) | ORAL | 0 refills | Status: AC | PRN
Start: 2018-05-21 — End: ?

## 2018-05-21 MED ORDER — KETOROLAC TROMETHAMINE 30 MG/ML INJECTION
30 mg/mL (1 mL) | INTRAMUSCULAR | Status: AC
Start: 2018-05-21 — End: 2018-05-21
  Administered 2018-05-21: 18:00:00 via INTRAVENOUS

## 2018-05-21 MED ORDER — MORPHINE 4 MG/ML INTRAVENOUS SOLUTION
4 mg/mL | Freq: Once | INTRAVENOUS | Status: AC
Start: 2018-05-21 — End: 2018-05-21
  Administered 2018-05-21: 18:00:00 via INTRAVENOUS

## 2018-05-21 MED ORDER — OXYCODONE-ACETAMINOPHEN 5 MG-325 MG TAB
5-325 mg | ORAL_TABLET | Freq: Four times a day (QID) | ORAL | 0 refills | Status: AC | PRN
Start: 2018-05-21 — End: 2018-05-24

## 2018-05-21 MED FILL — MORPHINE 4 MG/ML INTRAVENOUS SOLUTION: 4 mg/mL | INTRAVENOUS | Qty: 1

## 2018-05-21 MED FILL — KETOROLAC TROMETHAMINE 30 MG/ML INJECTION: 30 mg/mL (1 mL) | INTRAMUSCULAR | Qty: 1

## 2018-05-21 NOTE — ED Provider Notes (Signed)
EMERGENCY DEPARTMENT HISTORY AND PHYSICAL EXAM      Date: 05/21/2018  Patient Name: Diane Nelson    Please note that this dictation was completed with Dragon, the computer voice recognition software.  Quite often unanticipated grammatical, syntax, homophones, and other interpretive errors are inadvertently transcribed by the computer software.  Please disregard these errors.  Please excuse any errors that have escaped final proofreading.    History of Presenting Illness     Chief Complaint   Patient presents with   ??? Ankle Injury     left ankle pain   ??? Arm Pain     right arm pain   ??? Fall     fell last night down 4 steps coming down steps, having more pain today       History Provided By: Patient    HPI: Diane Nelson, 32 y.o. female, presenting the emergency department complaining of right ankle pain and left forearm pain status post  fall down 3 or 4 steps.  Denies any loss of consciousness.  Was unable to ambulate, bear weight on the right ankle due to pain.  This morning she woke up with severe right ankle pain is been unable to walk.  Also complaining of left forearm pain, worse at the left elbow.  Did not hit her head, did not lose consciousness.  No vomiting.  Patient denies any alcohol intake.  Denies any other injury.  Denies any open wounds.  Pain is worse with movement, has not tried anything for the pain at home.  PCP: None    No current facility-administered medications on file prior to encounter.      No current outpatient medications on file prior to encounter.       Past History     Past Medical History:  No past medical history on file.    Past Surgical History:  Past Surgical History:   Procedure Laterality Date   ??? HX CHOLECYSTECTOMY  03/26/2015    Lap CHole   ??? PR ERCP W/SPHINCTEROTOMY/PAPILLOTOMY  04/01/2015            Family History:  No family history on file.    Social History:  Social History     Tobacco Use   ??? Smoking status: Never Smoker   Substance Use Topics   ??? Alcohol use: Yes      Comment: past social drinker    ??? Drug use: Yes     Types: Prescription, OTC       Allergies:  No Known Allergies      Review of Systems   Review of Systems   Constitutional: Negative for fever.   HENT: Negative for congestion.    Respiratory: Negative for shortness of breath.    Gastrointestinal: Negative for abdominal pain, nausea and vomiting.   Musculoskeletal: Positive for arthralgias and myalgias. Negative for back pain.   Skin: Negative for rash and wound.   Hematological: Does not bruise/bleed easily.         Physical Exam   Physical Exam   Constitutional: oriented to person, place, and time. appears well-developed and well-nourished.   HENT:   Head: Normocephalic and atraumatic.   Neck trachea midline   Cardiovascular: Normal peripheral perfusion  Pulmonary/Chest: No respiratory distress, equal chest rise  Abdominal: Nondistended abdomen  Musculoskeletal: Swelling of the right ankle.  Tenderness to palpation over the medial, lateral, anterior joint line.  No tenderness palpation at the fibular head patient is tender to palpation over  the left forearm greater at the left elbow.  No gross deformity seen.  Neurological: alert and oriented to person, place, and time.   Skin: No rash noted. No erythema.   Psychiatric: behavior is normal.   Nursing note and vitals reviewed.    Diagnostic Study Results     Labs -   No results found for this or any previous visit (from the past 12 hour(s)).    Radiologic Studies -   XR FOREARM LT AP/LAT   Final Result   IMPRESSION: No acute abnormality.      XR ANKLE RT MIN 3 V   Final Result   Addendum 1 of 1   Addendum:       The report should read as follows. The initial order was incorrect    concerning   the side involved.      EXAM: XR ANKLE LT MIN 3 V      INDICATION: fall with swelling to area.      COMPARISON: None.      FINDINGS: Three views of the right ankle demonstrate no fracture or    disruption    of the ankle mortise.  There is no other acute osseous or articular    abnormality.    There is anterolateral soft tissue swelling.      IMPRESSION: No fracture or dislocation.            Final        CT Results  (Last 48 hours)    None        CXR Results  (Last 48 hours)    None            Medical Decision Making   I am the first provider for this patient.    I reviewed the vital signs, available nursing notes, past medical history, past surgical history, family history and social history.    Vital Signs-Reviewed the patient's vital signs.  Patient Vitals for the past 12 hrs:   Temp Resp BP SpO2   05/21/18 1600 ??? ??? ??? 97 %   05/21/18 1530 ??? ??? 94/54 95 %   05/21/18 1329 98 ??F (36.7 ??C) 18 112/69 99 %       Records Reviewed: Nursing Notes    Provider Notes (Medical Decision Making):   Sprain strain fracture      ED Course:   Initial assessment performed. The patients presenting problems have been discussed, and they are in agreement with the care plan formulated and outlined with them.  I have encouraged them to ask questions as they arise throughout their visit.           Disposition:  DISCHARGE NOTE  Patients results have been reviewed with them.  Patient and/or family have verbally conveyed their understanding and agreement of the patient's signs, symptoms, diagnosis, treatment and prognosis and additionally agree to follow up as recommended or return to the Emergency Room should their condition change or have any new concerns prior to their follow-up appointment. Patient verbally agrees with the care-plan and verbally conveys that all of their questions have been answered.   Discharge instructions have also been provided to the patient with some educational information regarding their diagnosis as well a list of reasons why they would want to return to the ER prior to their follow-up appointment should their condition change.    PLAN:  1.   Discharge Medication List as of 05/21/2018  3:40 PM  START taking these medications    Details   oxyCODONE-acetaminophen (PERCOCET) 5-325 mg per tablet Take 1 Tab by mouth every six (6) hours as needed for Pain for up to 3 days. Max Daily Amount: 4 Tabs., Print, Disp-5 Tab, R-0      ibuprofen (MOTRIN) 600 mg tablet Take 1 Tab by mouth every six (6) hours as needed for Pain., Print, Disp-20 Tab, R-0           2.   Follow-up Information     Follow up With Specialties Details Why Contact Info    [facility] Emergency Medicine  If symptoms worsen [address]  [place] [address]  [phone]    [facility]  In 4 days  [address]  [address]  [place] [address]  [phone]          Return to ED if worse     Diagnosis     Clinical Impression:   1. Sprain of right ankle, unspecified ligament, initial encounter    2. Arm contusion, left, initial encounter        Attestations:  This note was completed by Marylu Lund, DO

## 2018-05-21 NOTE — ED Notes (Signed)
Crutch walking instructions given, demonstrated well. Pt. Verbalized understanding instructions. Pt. Discharged to home by MD alert and oriented x 3, no distress. Family to pick up

## 2018-05-21 NOTE — ED Notes (Signed)
Crutch walking instructions given, demonstrated well. Pt. Verbalized understanding instructions. Pt. Discharged to home by MD alert and oriented x 3, no distress. Family to pick up

## 2018-05-21 NOTE — ED Notes (Signed)
Pt. Alert and oriented x 3, no distress. Air splint applied to rt. Ankle per MD order. Pt. Stated that her lt. Arm hurts too much to use crutches, MD made aware.

## 2018-05-21 NOTE — ED Notes (Signed)
Xray bedside.

## 2018-05-21 NOTE — ED Provider Notes (Signed)
ED  Provider Notes by Marylu Lund, DO at 05/21/18 1347                Author: Marylu Lund, DO  Service: EMERGENCY  Author Type: Physician       Filed: 05/22/18 0004  Date of Service: 05/21/18 1347  Status: Signed          Editor: Marylu Lund, DO (Physician)               EMERGENCY DEPARTMENT HISTORY AND PHYSICAL EXAM           Date: 05/21/2018   Patient Name: Diane Nelson      Please note that this dictation was completed with Dragon, the computer voice recognition software.  Quite often unanticipated grammatical, syntax, homophones, and other interpretive errors are inadvertently transcribed by the computer software.  Please  disregard these errors.  Please excuse any errors that have escaped final proofreading.        History of Presenting Illness          Chief Complaint       Patient presents with        ?  Ankle Injury             left ankle pain        ?  Arm Pain             right arm pain        ?  Fall             fell last night down 4 steps coming down steps, having more pain today           History Provided By: Patient      HPI: Diane Nelson,  32 y.o. female, presenting the emergency department complaining of right ankle pain and  left forearm pain status post  fall down 3 or 4 steps.  Denies any loss of consciousness.  Was unable to ambulate, bear weight on the right ankle due to pain.  This morning she woke up with severe right ankle pain is been unable to walk.  Also complaining  of left forearm pain, worse at the left elbow.  Did not hit her head, did not lose consciousness.  No vomiting.  Patient denies any alcohol intake.  Denies any other injury.  Denies any open wounds.  Pain is worse with movement, has not tried anything  for the pain at home.   PCP: None        No current facility-administered medications on file prior to encounter.           No current outpatient medications on file prior to encounter.             Past History        Past Medical History:   No past medical  history on file.      Past Surgical History:     Past Surgical History:         Procedure  Laterality  Date          ?  HX CHOLECYSTECTOMY    03/26/2015          Lap CHole          ?  PR ERCP W/SPHINCTEROTOMY/PAPILLOTOMY    04/01/2015                      Family History:   No family history on file.  Social History:     Social History          Tobacco Use         ?  Smoking status:  Never Smoker       Substance Use Topics         ?  Alcohol use:  Yes             Comment: past social drinker          ?  Drug use:  Yes              Types:  Prescription, OTC           Allergies:   No Known Allergies           Review of Systems     Review of Systems    Constitutional: Negative for fever.    HENT: Negative for congestion.     Respiratory: Negative for shortness of breath.     Gastrointestinal: Negative for abdominal pain, nausea and vomiting.    Musculoskeletal: Positive for arthralgias and myalgias . Negative for back pain.    Skin: Negative for rash and wound.    Hematological: Does not bruise/bleed easily.               Physical Exam     Physical Exam   Constitutional: oriented to person, place, and time. appears well-developed and well-nourished.   HENT:   Head: Normocephalic and atraumatic.    Neck trachea midline    Cardiovascular: Normal peripheral perfusion  Pulmonary/Chest: No respiratory distress, equal chest rise  Abdominal: Nondistended abdomen  Musculoskeletal: Swelling of the right ankle.  Tenderness to palpation over the medial, lateral, anterior  joint line.  No tenderness palpation at the fibular head patient is tender to palpation over the left forearm greater at the left elbow.  No gross deformity seen.  Neurological: alert and oriented to person, place, and time.   Skin: No rash noted.  No erythema.   Psychiatric: behavior is normal.   Nursing note and vitals reviewed.        Diagnostic Study Results        Labs -    No results found for this or any previous visit (from the past 12 hour(s)).       Radiologic Studies -      XR FOREARM LT AP/LAT       Final Result     IMPRESSION: No acute abnormality.            XR ANKLE RT MIN 3 V       Final Result     Addendum 1 of 1     Addendum:           The report should read as follows. The initial order was incorrect      concerning     the side involved.          EXAM: XR ANKLE LT MIN 3 V          INDICATION: fall with swelling to area.          COMPARISON: None.          FINDINGS: Three views of the right ankle demonstrate no fracture or      disruption     of the ankle mortise.  There is no other acute osseous or articular      abnormality.      There is anterolateral soft tissue swelling.  IMPRESSION: No fracture or dislocation.                    Final                 CT Results   (Last 48 hours)          None                 CXR Results   (Last 48 hours)          None                       Medical Decision Making     I am the first provider for this patient.      I reviewed the vital signs, available nursing notes, past medical history, past surgical history, family history and social history.      Vital Signs-Reviewed the patient's vital signs.   Patient Vitals for the past 12 hrs:           Temp  Resp  BP  SpO2           05/21/18 1600  --  --  --  97 %           05/21/18 1530  --  --  94/54  95 %           05/21/18 1329  98 ??F (36.7 ??C)  18  112/69  99 %           Records Reviewed: Nursing Notes      Provider Notes (Medical Decision Making):    Sprain strain fracture         ED Course:    Initial assessment performed. The patients presenting problems have been discussed, and they are in agreement with the care plan formulated and outlined with them.  I have encouraged them to ask questions as they arise throughout their visit.                Disposition:   DISCHARGE NOTE  Patients results have been reviewed with them.  Patient and/or family have verbally conveyed their understanding and agreement of the patient's signs, symptoms, diagnosis, treatment   and prognosis and additionally agree to follow up as recommended or return to the Emergency Room should their condition change or have any new concerns prior to their follow-up appointment. Patient verbally agrees with the care-plan and verbally conveys  that all of their questions have been answered.   Discharge instructions have also been provided to the patient with some educational information regarding their diagnosis as well a list of reasons why they would want to return to the ER prior to their  follow-up appointment should their condition change.      PLAN:   1.      Discharge Medication List as of 05/21/2018  3:40 PM              START taking these medications          Details        oxyCODONE-acetaminophen (PERCOCET) 5-325 mg per tablet  Take 1 Tab by mouth every six (6) hours as needed for Pain for up to 3 days. Max Daily Amount: 4 Tabs., Print, Disp-5 Tab, R-0               ibuprofen (MOTRIN) 600 mg tablet  Take 1 Tab by mouth every six (6) hours as needed for Pain., Print,  Disp-20 Tab, R-0                      2.      Follow-up Information               Follow up With  Specialties  Details  Why  Contact Info              [facility]  Emergency Medicine    If symptoms worsen  [address]   [place] [address]   [phone]              Linus [facility]n    In 4 days    [address]   [address]   [place] [address]   [phone]                Return to ED if worse         Diagnosis        Clinical Impression:       1.  Sprain of right ankle, unspecified ligament, initial encounter         2.  Arm contusion, left, initial encounter            Attestations:   This note was completed by Marylu Lund, DO

## 2018-05-21 NOTE — ED Notes (Signed)
Pt. Alert and oriented x 3, no distress. Air splint applied to rt. Ankle per MD order. Pt. Stated that her lt. Arm hurts too much to use crutches, MD made aware.

## 2018-05-25 ENCOUNTER — Inpatient Hospital Stay
Admit: 2018-05-25 | Discharge: 2018-05-26 | Disposition: A | Discharge status: Home / Self Care | Payer: Self-pay | Attending: Emergency Medicine

## 2018-05-25 ENCOUNTER — Emergency Department: Admit: 2018-05-26 | Payer: Self-pay

## 2018-05-25 DIAGNOSIS — S63502A Unspecified sprain of left wrist, initial encounter: Secondary | ICD-10-CM

## 2018-05-25 LAB — CBC WITH AUTOMATED DIFF
ABS. BASOPHILS: 0 10*3/uL (ref 0.0–0.1)
ABS. EOSINOPHILS: 0.3 10*3/uL (ref 0.0–0.4)
ABS. IMM. GRANS.: 0 10*3/uL (ref 0.00–0.04)
ABS. LYMPHOCYTES: 2.7 10*3/uL (ref 0.8–3.5)
ABS. MONOCYTES: 0.5 10*3/uL (ref 0.0–1.0)
ABS. NEUTROPHILS: 6.8 10*3/uL (ref 1.8–8.0)
ABSOLUTE NRBC: 0 10*3/uL (ref 0.00–0.01)
BASOPHILS: 0 % (ref 0–1)
EOSINOPHILS: 3 % (ref 0–7)
HCT: 36.7 % (ref 35.0–47.0)
HGB: 11.8 g/dL (ref 11.5–16.0)
IMMATURE GRANULOCYTES: 0 % (ref 0.0–0.5)
LYMPHOCYTES: 26 % (ref 12–49)
MCH: 26.9 PG (ref 26.0–34.0)
MCHC: 32.2 g/dL (ref 30.0–36.5)
MCV: 83.8 FL (ref 80.0–99.0)
MONOCYTES: 5 % (ref 5–13)
MPV: 9 FL (ref 8.9–12.9)
NEUTROPHILS: 66 % (ref 32–75)
NRBC: 0 PER 100 WBC
PLATELET: 523 10*3/uL — ABNORMAL HIGH (ref 150–400)
RBC: 4.38 M/uL (ref 3.80–5.20)
RDW: 12.7 % (ref 11.5–14.5)
WBC: 10.3 10*3/uL (ref 3.6–11.0)

## 2018-05-25 LAB — METABOLIC PANEL, COMPREHENSIVE
A-G Ratio: 0.5 — ABNORMAL LOW (ref 1.1–2.2)
ALT (SGPT): 20 U/L (ref 12–78)
AST (SGOT): 21 U/L (ref 15–37)
Albumin: 3 g/dL — ABNORMAL LOW (ref 3.5–5.0)
Alk. phosphatase: 108 U/L (ref 45–117)
Anion gap: 5 mmol/L (ref 5–15)
BUN/Creatinine ratio: 7 — ABNORMAL LOW (ref 12–20)
BUN: 6 MG/DL (ref 6–20)
Bilirubin, total: 0.3 MG/DL (ref 0.2–1.0)
CO2: 28 mmol/L (ref 21–32)
Calcium: 9 MG/DL (ref 8.5–10.1)
Chloride: 105 mmol/L (ref 97–108)
Creatinine: 0.81 MG/DL (ref 0.55–1.02)
GFR est AA: >60 mL/min/{1.73_m2} (ref >60)
GFR est non-AA: >60 mL/min/{1.73_m2} (ref >60)
Globulin: 5.8 g/dL — ABNORMAL HIGH (ref 2.0–4.0)
Glucose: 78 mg/dL (ref 65–100)
Potassium: 4 mmol/L (ref 3.5–5.1)
Protein, total: 8.8 g/dL — ABNORMAL HIGH (ref 6.4–8.2)
Sodium: 138 mmol/L (ref 136–145)

## 2018-05-25 LAB — CBC WITH AUTO DIFFERENTIAL
Basophils %: 0 % (ref 0–1)
Basophils Absolute: 0 10*3/uL (ref 0.0–0.1)
Eosinophils %: 3 % (ref 0–7)
Eosinophils Absolute: 0.3 10*3/uL (ref 0.0–0.4)
Granulocyte Absolute Count: 0 10*3/uL (ref 0.00–0.04)
Hematocrit: 36.7 % (ref 35.0–47.0)
Hemoglobin: 11.8 g/dL (ref 11.5–16.0)
Immature Granulocytes: 0 % (ref 0.0–0.5)
Lymphocytes %: 26 % (ref 12–49)
Lymphocytes Absolute: 2.7 10*3/uL (ref 0.8–3.5)
MCH: 26.9 PG (ref 26.0–34.0)
MCHC: 32.2 g/dL (ref 30.0–36.5)
MCV: 83.8 FL (ref 80.0–99.0)
MPV: 9 FL (ref 8.9–12.9)
Monocytes %: 5 % (ref 5–13)
Monocytes Absolute: 0.5 10*3/uL (ref 0.0–1.0)
NRBC Absolute: 0 10*3/uL (ref 0.00–0.01)
Neutrophils %: 66 % (ref 32–75)
Neutrophils Absolute: 6.8 10*3/uL (ref 1.8–8.0)
Nucleated RBCs: 0 PER 100 WBC
Platelets: 523 10*3/uL — ABNORMAL HIGH (ref 150–400)
RBC: 4.38 M/uL (ref 3.80–5.20)
RDW: 12.7 % (ref 11.5–14.5)
WBC: 10.3 10*3/uL (ref 3.6–11.0)

## 2018-05-25 LAB — COMPREHENSIVE METABOLIC PANEL
ALT: 20 U/L (ref 12–78)
AST: 21 U/L (ref 15–37)
Albumin/Globulin Ratio: 0.5 — ABNORMAL LOW (ref 1.1–2.2)
Albumin: 3 g/dL — ABNORMAL LOW (ref 3.5–5.0)
Alkaline Phosphatase: 108 U/L (ref 45–117)
Anion Gap: 5 mmol/L (ref 5–15)
BUN: 6 MG/DL (ref 6–20)
Bun/Cre Ratio: 7 — ABNORMAL LOW (ref 12–20)
CO2: 28 mmol/L (ref 21–32)
Calcium: 9 MG/DL (ref 8.5–10.1)
Chloride: 105 mmol/L (ref 97–108)
Creatinine: 0.81 MG/DL (ref 0.55–1.02)
EGFR IF NonAfrican American: >60 mL/min/{1.73_m2} (ref >60)
GFR African American: >60 mL/min/{1.73_m2} (ref >60)
Globulin: 5.8 g/dL — ABNORMAL HIGH (ref 2.0–4.0)
Glucose: 78 mg/dL (ref 65–100)
Potassium: 4 mmol/L (ref 3.5–5.1)
Sodium: 138 mmol/L (ref 136–145)
Total Bilirubin: 0.3 MG/DL (ref 0.2–1.0)
Total Protein: 8.8 g/dL — ABNORMAL HIGH (ref 6.4–8.2)

## 2018-05-25 MED ORDER — OXYCODONE-ACETAMINOPHEN 5 MG-325 MG TAB
5-325 mg | ORAL | Status: AC
Start: 2018-05-25 — End: 2018-05-25
  Administered 2018-05-25: via ORAL

## 2018-05-25 MED ORDER — ONDANSETRON 4 MG TAB, RAPID DISSOLVE
4 mg | ORAL | Status: AC
Start: 2018-05-25 — End: 2018-05-25
  Administered 2018-05-25: via ORAL

## 2018-05-25 MED FILL — ONDANSETRON 4 MG TAB, RAPID DISSOLVE: 4 mg | ORAL | Qty: 1

## 2018-05-25 MED FILL — OXYCODONE-ACETAMINOPHEN 5 MG-325 MG TAB: 5-325 mg | ORAL | Qty: 1

## 2018-05-25 NOTE — ED Provider Notes (Signed)
EMERGENCY DEPARTMENT HISTORY AND PHYSICAL EXAM      Date: 05/25/2018  Patient Name: Diane Nelson    History of Presenting Illness     Chief Complaint   Patient presents with   ??? Fall     Was seen for the same fall 8/31 and now c/o bilateral arm swelling and right arm swelling since started new medication       History Provided By: Patient    HPI: Diane Nelson, 32 y.o. female presents to the ED with cc of left hand wrist swelling.  The patient fell on August 31 and had x-rays of her left forearm and right ankle.  She had no fractures but she was placed in a ankle Aircast.  She had been taking ibuprofen for her pain, but states she developed  Nosebleeds,so she stopped taking it.  She is concerned that she has  swelling of the left hand and wrist and her pain is currently a 9 out of 10 in severity.  She denies numbness, tingling, chest pain or shortness of breath.  She also notes some swelling to her right hand, but denies any significant pain.  She is right-hand dominant.  Her pain in the right ankle is moderate.    There are no other complaints, changes, or physical findings at this time.    PCP: None    No current facility-administered medications on file prior to encounter.      Current Outpatient Medications on File Prior to Encounter   Medication Sig Dispense Refill   ??? [EXPIRED] oxyCODONE-acetaminophen (PERCOCET) 5-325 mg per tablet Take 1 Tab by mouth every six (6) hours as needed for Pain for up to 3 days. Max Daily Amount: 4 Tabs. 5 Tab 0   ??? ibuprofen (MOTRIN) 600 mg tablet Take 1 Tab by mouth every six (6) hours as needed for Pain. 20 Tab 0       Past History     Past Medical History:  No past medical history on file.    Past Surgical History:  Past Surgical History:   Procedure Laterality Date   ??? HX CHOLECYSTECTOMY  03/26/2015    Lap CHole   ??? PR ERCP W/SPHINCTEROTOMY/PAPILLOTOMY  04/01/2015            Family History:  No family history on file.    Social History:  Social History     Tobacco Use    ??? Smoking status: Never Smoker   Substance Use Topics   ??? Alcohol use: Yes     Comment: past social drinker    ??? Drug use: Yes     Types: Prescription, OTC       Allergies:  No Known Allergies      Review of Systems   Review of Systems   Constitutional: Negative for chills and fever.   HENT: Negative for congestion.    Eyes: Negative.    Respiratory: Negative for shortness of breath.    Cardiovascular: Negative for chest pain.   Gastrointestinal: Negative for abdominal pain.   Endocrine: Negative for heat intolerance.   Genitourinary: Negative for dysuria.   Musculoskeletal: Negative for back pain.   Skin: Negative for rash.   Allergic/Immunologic: Negative for immunocompromised state.   Neurological: Negative for dizziness.   Hematological: Does not bruise/bleed easily.   Psychiatric/Behavioral: Negative.    All other systems reviewed and are negative.      Physical Exam   Physical Exam   Constitutional: She is oriented to  person, place, and time. She appears well-developed and well-nourished. No distress.   HENT:   Head: Normocephalic and atraumatic.   Eyes: Pupils are equal, round, and reactive to light. EOM are normal.   Neck: Normal range of motion. Neck supple.   Cardiovascular: Normal rate, regular rhythm, normal heart sounds and intact distal pulses.   Pulmonary/Chest: Effort normal and breath sounds normal. No respiratory distress.   Abdominal: Soft. Bowel sounds are normal. She exhibits no mass. There is no tenderness.   Musculoskeletal: Normal range of motion. She exhibits edema and tenderness.   left hand and wrist edema, tenderness, right ankle edema and mild tenderness.  Mild right hand edema   Neurological: She is alert and oriented to person, place, and time. Coordination normal.   Sensory and motor intact   Skin: Skin is warm and dry.   Psychiatric: She has a normal mood and affect. Her behavior is normal.   Nursing note and vitals reviewed.      Diagnostic Study Results     Labs -      Recent Results (from the past 12 hour(s))   CBC WITH AUTOMATED DIFF    Collection Time: 05/25/18  6:12 PM   Result Value Ref Range    WBC 10.3 3.6 - 11.0 K/uL    RBC 4.38 3.80 - 5.20 M/uL    HGB 11.8 11.5 - 16.0 g/dL    HCT 36.7 35.0 - 47.0 %    MCV 83.8 80.0 - 99.0 FL    MCH 26.9 26.0 - 34.0 PG    MCHC 32.2 30.0 - 36.5 g/dL    RDW 12.7 11.5 - 14.5 %    PLATELET 523 (H) 150 - 400 K/uL    MPV 9.0 8.9 - 12.9 FL    NRBC 0.0 0 PER 100 WBC    ABSOLUTE NRBC 0.00 0.00 - 0.01 K/uL    NEUTROPHILS 66 32 - 75 %    LYMPHOCYTES 26 12 - 49 %    MONOCYTES 5 5 - 13 %    EOSINOPHILS 3 0 - 7 %    BASOPHILS 0 0 - 1 %    IMMATURE GRANULOCYTES 0 0.0 - 0.5 %    ABS. NEUTROPHILS 6.8 1.8 - 8.0 K/UL    ABS. LYMPHOCYTES 2.7 0.8 - 3.5 K/UL    ABS. MONOCYTES 0.5 0.0 - 1.0 K/UL    ABS. EOSINOPHILS 0.3 0.0 - 0.4 K/UL    ABS. BASOPHILS 0.0 0.0 - 0.1 K/UL    ABS. IMM. GRANS. 0.0 0.00 - 0.04 K/UL    DF AUTOMATED     METABOLIC PANEL, COMPREHENSIVE    Collection Time: 05/25/18  6:12 PM   Result Value Ref Range    Sodium 138 136 - 145 mmol/L    Potassium 4.0 3.5 - 5.1 mmol/L    Chloride 105 97 - 108 mmol/L    CO2 28 21 - 32 mmol/L    Anion gap 5 5 - 15 mmol/L    Glucose 78 65 - 100 mg/dL    BUN 6 6 - 20 MG/DL    Creatinine 0.81 0.55 - 1.02 MG/DL    BUN/Creatinine ratio 7 (L) 12 - 20      GFR est AA >60 >60 ml/min/1.40m    GFR est non-AA >60 >60 ml/min/1.779m   Calcium 9.0 8.5 - 10.1 MG/DL    Bilirubin, total 0.3 0.2 - 1.0 MG/DL    ALT (SGPT) 20 12 - 78 U/L  AST (SGOT) 21 15 - 37 U/L    Alk. phosphatase 108 45 - 117 U/L    Protein, total 8.8 (H) 6.4 - 8.2 g/dL    Albumin 3.0 (L) 3.5 - 5.0 g/dL    Globulin 5.8 (H) 2.0 - 4.0 g/dL    A-G Ratio 0.5 (L) 1.1 - 2.2         Radiologic Studies -   XR WRIST LT AP/LAT/OBL MIN 3V   Final Result   IMPRESSION: No acute abnormality.      XR HAND LT MIN 3 V   Final Result   IMPRESSION: No acute abnormality.        CT Results  (Last 48 hours)    None        CXR Results  (Last 48 hours)    None           Medical Decision Making   I am the first provider for this patient.    I reviewed the vital signs, available nursing notes, past medical history, past surgical history, family history and social history.    Vital Signs-Reviewed the patient's vital signs.  Patient Vitals for the past 12 hrs:   Temp Pulse Resp BP SpO2   05/25/18 1742 98.1 ??F (36.7 ??C) 77 18 111/71 97 %   05/25/18 1726 97.9 ??F (36.6 ??C) 70 14 134/87 ???         Records Reviewed: Nursing Notes, Old Medical Records, Previous Radiology Studies and Previous Laboratory Studies    Provider Notes (Medical Decision Making):   Fracture, sprain, contusion    ED Course:   Initial assessment performed. The patients presenting problems have been discussed, and they are in agreement with the care plan formulated and outlined with them.  I have encouraged them to ask questions as they arise throughout their visit.    Procedure Note - Splint Placement:  9:05 PM  Performed by: Dr. Tamala Julian  Neurovascularly intact prior to tx.  A Velcro  splint was placed on pt's left wrist.   .  Joint was placed in neutral position.  Neurovascularly intact after tx.   The procedure took 1-15 minutes, and pt tolerated well.    Progress mote:      Patient is feeling better.  Her results were reviewed.  She is advised to follow-up and return to ER if worse           Critical Care Time:   0    Disposition:  home    PLAN:  1.   Discharge Medication List as of 05/25/2018  9:17 PM      START taking these medications    Details   cyclobenzaprine (FLEXERIL) 10 mg tablet Take 1 Tab by mouth three (3) times daily as needed for Muscle Spasm(s)., Print, Disp-20 Tab, R-0         CONTINUE these medications which have NOT CHANGED    Details   ibuprofen (MOTRIN) 600 mg tablet Take 1 Tab by mouth every six (6) hours as needed for Pain., Print, Disp-20 Tab, R-0         STOP taking these medications       oxyCODONE-acetaminophen (PERCOCET) 5-325 mg per tablet Comments:   Reason for Stopping:             2.    Follow-up Information     Follow up With Specialties Details Why Contact Info    Santiago Bur, MD Orthopedic Surgery Call in 2 days As needed  [address]  [place]  [place] [address]  [phone]      [facility] Emergency Medicine  If symptoms worsen [address]  [place]  [phone]        Return to ED if worse     Diagnosis     Clinical Impression:   1. Sprain of left wrist, initial encounter    2. Contusion of left hand, initial encounter        Attestations:    Seward Speck, MD    Please note that this dictation was completed with Dragon, the computer voice recognition software.  Quite often unanticipated grammatical, syntax, homophones, and other interpretive errors are inadvertently transcribed by the computer software.  Please disregard these errors.  Please excuse any errors that have escaped final proofreading.  Thank you.

## 2018-05-25 NOTE — ED Notes (Signed)
Pt returned from imaging;

## 2018-05-25 NOTE — ED Notes (Signed)
Patient discharge by Katrinka Blazing MD - pt sent to the front lobby, via wheelchair -  Discharge information / home RX / and reasons to return to the ED were reviewed by the doctor.

## 2018-05-25 NOTE — ED Provider Notes (Signed)
ED Provider Notes by Seward Speck, MD at 05/25/18 1953                Author: Seward Speck, MD  Service: Emergency Medicine  Author Type: Physician       Filed: 05/27/18 1434  Date of Service: 05/25/18 1953  Status: Signed          Editor: Seward Speck, MD (Physician)               EMERGENCY DEPARTMENT HISTORY AND PHYSICAL EXAM           Date: 05/25/2018   Patient Name: Diane Nelson        History of Presenting Illness          Chief Complaint       Patient presents with        ?  Fall             Was seen for the same fall 8/31 and now c/o bilateral arm swelling and right arm swelling since started new medication           History Provided By: Patient      HPI: Diane Nelson,  32 y.o. female presents to the ED with cc of left hand wrist swelling.  The patient fell  on August 31 and had x-rays of her left forearm and right ankle.  She had no fractures but she was placed in a ankle Aircast.  She had been taking ibuprofen for her pain, but states she developed  Nosebleeds,so she stopped taking it.  She is concerned  that she has  swelling of the left hand and wrist and her pain is currently a 9 out of 10 in severity.  She denies numbness, tingling, chest pain or shortness of breath.  She also notes some swelling to her right hand, but denies any significant pain.   She is right-hand dominant.  Her pain in the right ankle is moderate.      There are no other complaints, changes, or physical findings at this time.      PCP: None        No current facility-administered medications on file prior to encounter.           Current Outpatient Medications on File Prior to Encounter          Medication  Sig  Dispense  Refill           ?  [EXPIRED] oxyCODONE-acetaminophen (PERCOCET) 5-325 mg per tablet  Take 1 Tab by mouth every six (6) hours as needed for Pain for up to 3 days. Max Daily Amount: 4 Tabs.  5 Tab  0           ?  ibuprofen (MOTRIN) 600 mg tablet  Take 1 Tab by mouth every six (6) hours as  needed for Pain.  20 Tab  0             Past History        Past Medical History:   No past medical history on file.      Past Surgical History:     Past Surgical History:         Procedure  Laterality  Date          ?  HX CHOLECYSTECTOMY    03/26/2015          Lap CHole          ?  PR ERCP W/SPHINCTEROTOMY/PAPILLOTOMY  04/01/2015                      Family History:   No family history on file.      Social History:     Social History          Tobacco Use         ?  Smoking status:  Never Smoker       Substance Use Topics         ?  Alcohol use:  Yes             Comment: past social drinker          ?  Drug use:  Yes              Types:  Prescription, OTC           Allergies:   No Known Allergies           Review of Systems     Review of Systems    Constitutional: Negative for chills and fever.    HENT: Negative for congestion.     Eyes: Negative.     Respiratory: Negative for shortness of breath.     Cardiovascular: Negative for chest pain.    Gastrointestinal: Negative for abdominal pain.    Endocrine: Negative for heat intolerance.    Genitourinary: Negative for dysuria.    Musculoskeletal: Negative for back pain.    Skin: Negative for rash.    Allergic/Immunologic: Negative for immunocompromised state.    Neurological: Negative for dizziness.    Hematological: Does not bruise/bleed easily.    Psychiatric/Behavioral: Negative.     All other systems reviewed and are negative.           Physical Exam     Physical Exam    Constitutional: She is oriented to person, place, and time. She appears well-developed and well-nourished. No distress.    HENT:    Head: Normocephalic and atraumatic.    Eyes: Pupils are equal, round, and reactive to light. EOM are normal.    Neck: Normal range of motion. Neck supple.    Cardiovascular: Normal rate, regular rhythm, normal heart sounds and intact distal pulses.    Pulmonary/Chest: Effort normal and breath sounds normal. No respiratory distress.    Abdominal: Soft. Bowel sounds are  normal. She exhibits no mass. There is no tenderness.   Musculoskeletal: Normal range of motion. She exhibits edema  and tenderness.   left hand and wrist edema, tenderness, right ankle edema  and mild tenderness.  Mild right hand edema   Neurological: She is alert and oriented  to person, place, and time. Coordination normal.   Sensory and motor intact    Skin: Skin is warm and dry.   Psychiatric: She has a normal mood and affect. Her behavior is normal.    Nursing note and vitals reviewed.           Diagnostic Study Results        Labs -         Recent Results (from the past 12 hour(s))     CBC WITH AUTOMATED DIFF          Collection Time: 05/25/18  6:12 PM         Result  Value  Ref Range            WBC  10.3  3.6 - 11.0 K/uL       RBC  4.38  3.80 - 5.20 M/uL       HGB  11.8  11.5 - 16.0 g/dL       HCT  36.7  35.0 - 47.0 %       MCV  83.8  80.0 - 99.0 FL       MCH  26.9  26.0 - 34.0 PG       MCHC  32.2  30.0 - 36.5 g/dL       RDW  12.7  11.5 - 14.5 %       PLATELET  523 (H)  150 - 400 K/uL       MPV  9.0  8.9 - 12.9 FL       NRBC  0.0  0 PER 100 WBC       ABSOLUTE NRBC  0.00  0.00 - 0.01 K/uL       NEUTROPHILS  66  32 - 75 %       LYMPHOCYTES  26  12 - 49 %       MONOCYTES  5  5 - 13 %       EOSINOPHILS  3  0 - 7 %       BASOPHILS  0  0 - 1 %       IMMATURE GRANULOCYTES  0  0.0 - 0.5 %       ABS. NEUTROPHILS  6.8  1.8 - 8.0 K/UL       ABS. LYMPHOCYTES  2.7  0.8 - 3.5 K/UL       ABS. MONOCYTES  0.5  0.0 - 1.0 K/UL       ABS. EOSINOPHILS  0.3  0.0 - 0.4 K/UL       ABS. BASOPHILS  0.0  0.0 - 0.1 K/UL       ABS. IMM. GRANS.  0.0  0.00 - 0.04 K/UL       DF  AUTOMATED          METABOLIC PANEL, COMPREHENSIVE          Collection Time: 05/25/18  6:12 PM         Result  Value  Ref Range            Sodium  138  136 - 145 mmol/L       Potassium  4.0  3.5 - 5.1 mmol/L       Chloride  105  97 - 108 mmol/L       CO2  28  21 - 32 mmol/L       Anion gap  5  5 - 15 mmol/L       Glucose  78  65 - 100 mg/dL       BUN  6  6 - 20  MG/DL       Creatinine  0.81  0.55 - 1.02 MG/DL       BUN/Creatinine ratio  7 (L)  12 - 20         GFR est AA  >60  >60 ml/min/1.72m       GFR est non-AA  >60  >60 ml/min/1.764m      Calcium  9.0  8.5 - 10.1 MG/DL       Bilirubin, total  0.3  0.2 - 1.0 MG/DL       ALT (SGPT)  20  12 - 78 U/L       AST (SGOT)  21  15 - 37 U/L  Alk. phosphatase  108  45 - 117 U/L       Protein, total  8.8 (H)  6.4 - 8.2 g/dL       Albumin  3.0 (L)  3.5 - 5.0 g/dL       Globulin  5.8 (H)  2.0 - 4.0 g/dL            A-G Ratio  0.5 (L)  1.1 - 2.2             Radiologic Studies -      XR WRIST LT AP/LAT/OBL MIN 3V       Final Result     IMPRESSION: No acute abnormality.            XR HAND LT MIN 3 V       Final Result     IMPRESSION: No acute abnormality.                 CT Results  (Last 48 hours)          None                 CXR Results  (Last 48  hours)          None                    Medical Decision Making     I am the first provider for this patient.      I reviewed the vital signs, available nursing notes, past medical history, past surgical history, family history and social history.      Vital Signs-Reviewed the patient's vital signs.   Patient Vitals for the past 12 hrs:            Temp  Pulse  Resp  BP  SpO2            05/25/18 1742  98.1 ??F (36.7 ??C)  77  18  111/71  97 %            05/25/18 1726  97.9 ??F (36.6 ??C)  70  14  134/87  --              Records Reviewed: Nursing Notes, Old Medical Records, Previous Radiology Studies and  Previous Laboratory Studies      Provider Notes (Medical Decision Making):    Fracture, sprain, contusion      ED Course:    Initial assessment performed. The patients presenting problems have been discussed, and they are in agreement with the care plan formulated and outlined with them.  I have encouraged them to ask questions as they arise throughout their visit.      Procedure Note - Splint Placement:   9:05 PM   Performed by: Dr. Tamala Julian   Neurovascularly intact prior to tx.  A Velcro   splint was placed on pt's left wrist.    .  Joint was placed in neutral position.  Neurovascularly intact after tx.    The procedure took 1-15 minutes, and pt tolerated well.      Progress mote:         Patient is feeling better.  Her results were reviewed.  She is advised to follow-up and return to ER if worse                Critical Care Time:    0      Disposition:   home      PLAN:  1.      Discharge Medication List as of 05/25/2018  9:17 PM              START taking these medications          Details        cyclobenzaprine (FLEXERIL) 10 mg tablet  Take 1 Tab by mouth three (3) times daily as needed for Muscle Spasm(s)., Print, Disp-20 Tab, R-0                     CONTINUE these medications which have NOT CHANGED          Details        ibuprofen (MOTRIN) 600 mg tablet  Take 1 Tab by mouth every six (6) hours as needed for Pain., Print, Disp-20 Tab, R-0                     STOP taking these medications                  oxyCODONE-acetaminophen (PERCOCET) 5-325 mg per tablet  Comments:    Reason for Stopping:                          2.      Follow-up Information               Follow up With  Specialties  Details  Why  Contact Info              Santiago Bur, MD  Orthopedic Surgery  Call in 2 days  As needed  [place]   [place] [address]   [phone]                 [facility] EMERGENCY DEPT  Emergency Medicine    If symptoms worsen  [address]   [place]   [phone]             Return to ED if worse         Diagnosis        Clinical Impression:       1.  Sprain of left wrist, initial encounter         2.  Contusion of left hand, initial encounter            Attestations:      Seward Speck, MD      Please note that this dictation was completed with Dragon, the computer voice recognition software.  Quite often unanticipated grammatical, syntax, homophones, and other interpretive errors are  inadvertently transcribed by the computer software.  Please  disregard these errors.  Please excuse any errors that have escaped final proofreading.  Thank you.

## 2018-05-25 NOTE — ED Notes (Signed)
Pt sent to radiology for imaging;;

## 2018-05-25 NOTE — ED Notes (Signed)
Pt returned from imaging.

## 2018-05-25 NOTE — ED Notes (Signed)
Smith MD at bedside for eval;

## 2018-05-25 NOTE — ED Notes (Addendum)
Patient discharge by Smith MD - pt sent to the front lobby, via wheelchair -  Discharge information / home RX / and reasons to return to the ED were reviewed by the doctor.

## 2018-05-26 MED ORDER — KETOROLAC TROMETHAMINE 30 MG/ML INJECTION
30 mg/mL (1 mL) | INTRAMUSCULAR | Status: AC
Start: 2018-05-26 — End: 2018-05-25
  Administered 2018-05-26: 01:00:00 via INTRAVENOUS

## 2018-05-26 MED ORDER — CYCLOBENZAPRINE 10 MG TAB
10 mg | ORAL_TABLET | Freq: Three times a day (TID) | ORAL | 0 refills | Status: AC | PRN
Start: 2018-05-26 — End: ?

## 2018-05-26 MED FILL — KETOROLAC TROMETHAMINE 30 MG/ML INJECTION: 30 mg/mL (1 mL) | INTRAMUSCULAR | Qty: 1

## 2018-07-28 ENCOUNTER — Encounter: Payer: Self-pay | Admitting: Emergency Medicine

## 2018-07-28 ENCOUNTER — Emergency Department: Payer: Self-pay

## 2018-07-28 ENCOUNTER — Inpatient Hospital Stay
Admission: EM | Admit: 2018-07-28 | Discharge: 2018-07-31 | DRG: 121 | Disposition: A | Discharge status: Other Acute Inpatient Hospital | Payer: Self-pay | Attending: Internal Medicine | Admitting: Internal Medicine

## 2018-07-28 ENCOUNTER — Other Ambulatory Visit: Payer: Self-pay

## 2018-07-28 DIAGNOSIS — H1012 Acute atopic conjunctivitis, left eye: Secondary | ICD-10-CM | POA: Diagnosis present

## 2018-07-28 DIAGNOSIS — H538 Other visual disturbances: Secondary | ICD-10-CM | POA: Diagnosis present

## 2018-07-28 DIAGNOSIS — H44009 Unspecified purulent endophthalmitis, unspecified eye: Secondary | ICD-10-CM | POA: Diagnosis present

## 2018-07-28 DIAGNOSIS — H05012 Cellulitis of left orbit: Principal | ICD-10-CM | POA: Diagnosis present

## 2018-07-28 HISTORY — DX: Other specified health status: Z78.9

## 2018-07-28 LAB — BASIC METABOLIC PANEL
ANION GAP: 8 (ref 5–15)
BUN: 9 mg/dL (ref 6–20)
CALCIUM: 9 mg/dL (ref 8.9–10.3)
CO2: 24 mmol/L (ref 22–32)
Chloride: 104 mmol/L (ref 98–111)
Creatinine, Ser: 0.79 mg/dL (ref 0.44–1.00)
GFR calc Af Amer: >60 mL/min (ref >60)
GLUCOSE: 92 mg/dL (ref 70–99)
Potassium: 3.6 mmol/L (ref 3.5–5.1)
Sodium: 136 mmol/L (ref 135–145)

## 2018-07-28 LAB — CBC
HCT: 40 % (ref 36.0–46.0)
Hemoglobin: 12.8 g/dL (ref 12.0–15.0)
MCH: 26.8 pg (ref 26.0–34.0)
MCHC: 32 g/dL (ref 30.0–36.0)
MCV: 83.9 fL (ref 80.0–100.0)
NRBC: 0 % (ref 0.0–0.2)
PLATELETS: 428 10*3/uL — AB (ref 150–400)
RBC: 4.77 MIL/uL (ref 3.87–5.11)
RDW: 15.7 % — AB (ref 11.5–15.5)
WBC: 8.2 10*3/uL (ref 4.0–10.5)

## 2018-07-28 MED ORDER — ACETAMINOPHEN 325 MG PO TABS: 650.0000 mg | ORAL_TABLET | Freq: Four times a day (QID) | ORAL | Status: DC | PRN

## 2018-07-28 MED ORDER — FENTANYL CITRATE (PF) 100 MCG/2ML IJ SOLN
50.0000 ug | INTRAMUSCULAR | Status: DC | PRN
  Administered 2018-07-28: 50 ug via INTRAVENOUS
  Filled 2018-07-28: qty 2

## 2018-07-28 MED ORDER — ENOXAPARIN SODIUM 40 MG/0.4ML ~~LOC~~ SOLN
40.0000 mg | SUBCUTANEOUS | Status: DC
  Filled 2018-07-28 (×2): qty 0.4

## 2018-07-28 MED ORDER — PIPERACILLIN-TAZOBACTAM 3.375 G IVPB 30 MIN
3.3750 g | Freq: Once | INTRAVENOUS | Status: AC
  Administered 2018-07-28: 3.375 g via INTRAVENOUS
  Filled 2018-07-28: qty 50

## 2018-07-28 MED ORDER — OXYCODONE-ACETAMINOPHEN 5-325 MG PO TABS
2.0000 | ORAL_TABLET | Freq: Once | ORAL | Status: AC
  Administered 2018-07-28: 2 via ORAL
  Filled 2018-07-28: qty 2

## 2018-07-28 MED ORDER — ACETAMINOPHEN 650 MG RE SUPP: 650.0000 mg | Freq: Four times a day (QID) | RECTAL | Status: DC | PRN

## 2018-07-28 MED ORDER — SODIUM CHLORIDE 0.9 % IV SOLN
3.0000 g | Freq: Three times a day (TID) | INTRAVENOUS | Status: DC
  Administered 2018-07-29 (×2): 3 g via INTRAVENOUS
  Filled 2018-07-28 (×4): qty 3

## 2018-07-28 MED ORDER — FENTANYL CITRATE (PF) 100 MCG/2ML IJ SOLN
INTRAMUSCULAR | Status: AC
  Administered 2018-07-28: 18:00:00
  Filled 2018-07-28: qty 2

## 2018-07-28 MED ORDER — ONDANSETRON HCL 4 MG/2ML IJ SOLN: 4.0000 mg | Freq: Four times a day (QID) | INTRAMUSCULAR | Status: DC | PRN

## 2018-07-28 MED ORDER — HYDROCODONE-ACETAMINOPHEN 5-325 MG PO TABS
1.0000 | ORAL_TABLET | ORAL | Status: DC | PRN
  Administered 2018-07-28: 2 via ORAL
  Filled 2018-07-28 (×2): qty 2

## 2018-07-28 MED ORDER — ONDANSETRON HCL 4 MG PO TABS: 4.0000 mg | ORAL_TABLET | Freq: Four times a day (QID) | ORAL | Status: DC | PRN

## 2018-07-28 MED ORDER — ONDANSETRON HCL 4 MG/2ML IJ SOLN
INTRAMUSCULAR | Status: AC
  Administered 2018-07-28: 17:00:00
  Filled 2018-07-28: qty 2

## 2018-07-28 MED ORDER — IOHEXOL 300 MG/ML  SOLN
75.0000 mL | Freq: Once | INTRAMUSCULAR | Status: AC | PRN
  Administered 2018-07-28: 75 mL via INTRAVENOUS

## 2018-07-28 MED ORDER — SODIUM CHLORIDE 0.9 % IV SOLN
250.0000 mL | INTRAVENOUS | Status: DC | PRN
  Administered 2018-07-29 – 2018-07-30 (×7): 250 mL via INTRAVENOUS

## 2018-07-28 MED ORDER — VANCOMYCIN HCL IN DEXTROSE 1-5 GM/200ML-% IV SOLN
1000.0000 mg | Freq: Once | INTRAVENOUS | Status: AC
  Administered 2018-07-28: 1000 mg via INTRAVENOUS
  Filled 2018-07-28: qty 200

## 2018-07-28 MED ORDER — SODIUM CHLORIDE 0.9% FLUSH
3.0000 mL | INTRAVENOUS | Status: DC | PRN
  Administered 2018-07-29: 3 mL via INTRAVENOUS
  Filled 2018-07-28: qty 3

## 2018-07-28 MED ORDER — VANCOMYCIN HCL 10 G IV SOLR
1250.0000 mg | Freq: Two times a day (BID) | INTRAVENOUS | Status: DC
  Administered 2018-07-29 – 2018-07-30 (×3): 1250 mg via INTRAVENOUS
  Filled 2018-07-28 (×4): qty 1250

## 2018-07-28 MED ORDER — KETOROLAC TROMETHAMINE 15 MG/ML IJ SOLN
15.0000 mg | Freq: Four times a day (QID) | INTRAMUSCULAR | Status: DC | PRN
  Administered 2018-07-28 – 2018-07-29 (×3): 15 mg via INTRAVENOUS
  Filled 2018-07-28 (×3): qty 1

## 2018-07-28 MED ORDER — SODIUM CHLORIDE 0.9% FLUSH
3.0000 mL | Freq: Two times a day (BID) | INTRAVENOUS | Status: DC
  Administered 2018-07-28 – 2018-07-30 (×5): 3 mL via INTRAVENOUS

## 2018-07-28 MED ORDER — ONDANSETRON HCL 4 MG/2ML IJ SOLN
4.0000 mg | Freq: Once | INTRAMUSCULAR | Status: AC
  Administered 2018-07-28: 4 mg via INTRAVENOUS

## 2018-07-28 NOTE — ED Notes (Signed)
Spoke with MD Shaune Pollack regarding pt presentation, see orders

## 2018-07-28 NOTE — Consult Note (Addendum)
Pharmacy Antibiotic Note  Lori Bryant is a 32 y.o. female admitted on 07/28/2018 with orbital cellulitis/conjunctivitis.  Pharmacy has been consulted for Vancomycin/Unasyn dosing.  Plan:  Unasyn 3g IV q 6 hours  Vancomycin 1250mg  q 12 hours with a 6 hour stacked dose and a target trough of 10-15.  Ke 0.097, VD 47.53, t 1/2 7.14, expected Cmin 13.01  Drawing VT prior to 4th dose.  Temp (24hrs), Avg:98.7 F (37.1 C), Min:98.7 F (37.1 C), Max:98.7 F (37.1 C)  Recent Labs  Lab 07/28/18 1730  WBC 8.2  CREATININE 0.79    CrCl cannot be calculated (Unknown ideal weight.).    No Known Allergies  Antimicrobials this admission: Vancomycin 11/7 >>  Unasyn 11/7 >>   Dose adjustments this admission: N/A  Microbiology results:   Thank you for allowing pharmacy to be a part of this patient's care.  Albina Billet, PharmD Clinical Pharmacist 07/28/2018 8:10 PM

## 2018-07-28 NOTE — ED Triage Notes (Addendum)
PT sent from  eye center for rule out orbital cellulitis. PT LFT eye swollen shut with + eye drainage x 2days. PT also c/o headache. VSS , pt appears uncomfortable

## 2018-07-28 NOTE — H&P (Signed)
[facility] - [place] at [facility]   PATIENT NAME: Lori Bryant    MR#:  [id number]  DATE OF BIRTH:  [birth year]  DATE OF ADMISSION:  07/28/2018  PRIMARY CARE PHYSICIAN: Patient, No Pcp Per   REQUESTING/REFERRING PHYSICIAN: Emily Filbert, MD  CHIEF COMPLAINT:   Chief Complaint  Patient presents with  . Eye Problem    HISTORY OF PRESENT ILLNESS: Lori Bryant  is a 32 y.o. female with no medical problems who is presenting to the ED with swelling and pain in the left eye.  Patient was seen by Dr. Delorise Shiner at [facility] eye clinic in the office who referred her to the ED for CT scan of her orbits.  Patient states that the symptoms started yesterday.  She is also having some blurred vision.  cT scan showed left intraorbital cellulitis and conjunctivitis.  ER physician called ophthalmologist who recommended admission and IV antibiotics.     PAST MEDICAL HISTORY:  History reviewed. No pertinent past medical history.  PAST SURGICAL HISTORY: History reviewed. No pertinent surgical history.  SOCIAL HISTORY:  Social History   Tobacco Use  . Smoking status: Never Smoker  . Smokeless tobacco: Never Used  Substance Use Topics  . Alcohol use: Not Currently    FAMILY HISTORY: No family history on file.  DRUG ALLERGIES: No Known Allergies  REVIEW OF SYSTEMS:   CONSTITUTIONAL: No fever, fatigue or weakness.  EYES: Positive blurred or double vision.  EARS, NOSE, AND THROAT: No tinnitus or ear pain.  RESPIRATORY: No cough, shortness of breath, wheezing or hemoptysis.  CARDIOVASCULAR: No chest pain, orthopnea, edema.  GASTROINTESTINAL: No nausea, vomiting, diarrhea or abdominal pain.  GENITOURINARY: No dysuria, hematuria.  ENDOCRINE: No polyuria, nocturia,  HEMATOLOGY: No anemia, easy bruising or bleeding SKIN: No rash or lesion. MUSCULOSKELETAL: No joint pain or arthritis.   NEUROLOGIC: No tingling, numbness, weakness.  PSYCHIATRY: No anxiety or depression.    MEDICATIONS AT HOME:  Prior to Admission medications   Not on File      PHYSICAL EXAMINATION:   VITAL SIGNS: Blood pressure 110/68, pulse 89, temperature 98.7 F (37.1 C), temperature source Oral, resp. rate 16, last menstrual period 07/28/2018, SpO2 96 %.  GENERAL:  32 y.o.-year-old patient lying in the bed with no acute distress.  EYES: Pupils equal, round, reactive to light and accommodation on right.  Left eye is swollen  HEENT: Head atraumatic, normocephalic. Oropharynx and nasopharynx clear.  NECK:  Supple, no jugular venous distention. No thyroid enlargement, no tenderness.  LUNGS: Normal breath sounds bilaterally, no wheezing, rales,rhonchi or crepitation. No use of accessory muscles of respiration.  CARDIOVASCULAR: S1, S2 normal. No murmurs, rubs, or gallops.  ABDOMEN: Soft, nontender, nondistended. Bowel sounds present. No organomegaly or mass.  EXTREMITIES: No pedal edema, cyanosis, or clubbing.  NEUROLOGIC: Cranial nerves II through XII are intact. Muscle strength 5/5 in all extremities. Sensation intact. Gait not checked.  PSYCHIATRIC: The patient is alert and oriented x 3.  SKIN: No obvious rash, lesion, or ulcer.   LABORATORY PANEL:   CBC Recent Labs  Lab 07/28/18 1730  WBC 8.2  HGB 12.8  HCT 40.0  PLT 428*  MCV 83.9  MCH 26.8  MCHC 32.0  RDW 15.7*   ------------------------------------------------------------------------------------------------------------------  Chemistries  Recent Labs  Lab 07/28/18 1730  NA 136  K 3.6  CL 104  CO2 24  GLUCOSE 92  BUN 9  CREATININE 0.79  CALCIUM 9.0   ------------------------------------------------------------------------------------------------------------------ CrCl cannot be calculated (Unknown ideal weight.). ------------------------------------------------------------------------------------------------------------------  No results for input(s): TSH, T4TOTAL, T3FREE, THYROIDAB in the last 72  hours.  Invalid input(s): FREET3   Coagulation profile No results for input(s): INR, PROTIME in the last 168 hours. ------------------------------------------------------------------------------------------------------------------- No results for input(s): DDIMER in the last 72 hours. -------------------------------------------------------------------------------------------------------------------  Cardiac Enzymes No results for input(s): CKMB, TROPONINI, MYOGLOBIN in the last 168 hours.  Invalid input(s): CK ------------------------------------------------------------------------------------------------------------------ Invalid input(s): POCBNP  ---------------------------------------------------------------------------------------------------------------  Urinalysis No results found for: COLORURINE, APPEARANCEUR, LABSPEC, PHURINE, GLUCOSEU, HGBUR, BILIRUBINUR, KETONESUR, PROTEINUR, UROBILINOGEN, NITRITE, LEUKOCYTESUR   RADIOLOGY: Ct Orbits W Contrast  Result Date: 07/28/2018 CLINICAL DATA:  LEFT eye swelling and drainage for 2 days. Headache. EXAM: CT ORBITS WITH CONTRAST TECHNIQUE: Multidetector CT images was performed according to the standard protocol following intravenous contrast administration. CONTRAST:  75mL OMNIPAQUE IOHEXOL 300 MG/ML  SOLN COMPARISON:  None. FINDINGS: ORBITS: Intact ocular globes. Lenses are located. LEFT scleral enhancement and mild infiltration of the extraconal and retrobulbar are fat. Mildly enlarged an indistinct LEFT optic nerve sheath complex. Preservation of the orbital fat. Normal appearance of the extraocular muscles which are well located. Superior ophthalmic veins are not enlarged. VISUALIZED SINUSES: Mild LEFT maxillary sinus mucosal thickening with air-fluid level. Trace RIGHT maxillary sinus mucosal thickening. SOFT TISSUES/BONES: No significant soft tissue swelling, no subcutaneous gas or radiopaque foreign bodies. No destructive bony lesions.  INTRACRANIAL CONTENTS: Normal. IMPRESSION: 1. LEFT intraorbital cellulitis and conjunctivitis.  No abscess. 2. Mild sinusitis. 3. Acute findings discussed with and reconfirmed by Dr.Williams on 07/28/2018 at 6:42 pm. Electronically Signed   By: Awilda Metro M.D.   On: 07/28/2018 18:45    EKG: No orders found for this or any previous visit.  IMPRESSION AND PLAN: Patient is 32 year old with orbital cellulitis  1.  Orbital cellulitis we will treat with IV vancomycin and Unasyn She has already been seen by the ophthalmologist as outpatient today If symptoms worsen ophthalmology needs to be re-contacted by the physician tomorrow.  2.  Miscellaneous Lovenox for DVT prophylaxis  All the records are reviewed and case discussed with ED provider. Management plans discussed with the patient, family and they are in agreement.  CODE STATUS: Full code   TOTAL TIME TAKING CARE OF THIS PATIENT: 55 minutes.    Auburn Bilberry M.D on 07/28/2018 at 7:15 PM  Between 7am to 6pm - Pager - [phone]  After 6pm go to [email or web address] - password [email or web address]  [facility] Office  [phone]  CC: Primary care physician; Patient, No Pcp Per

## 2018-07-28 NOTE — ED Provider Notes (Signed)
[facility] Emergency Department Provider Note       Time seen: ----------------------------------------- 6:43 PM on 07/28/2018 -----------------------------------------   I have reviewed the triage vital signs and the nursing notes.  HISTORY   Chief Complaint Eye Problem    HPI Lori Bryant is a 32 y.o. female with no significant past medical history who presents to the ED for possible orbital cellulitis.  Patient was sent from [place] [facility] for eye eye swelling and pain.  Patient states essentially this started yesterday with eye itching.  Now the eyes swollen shut with drainage.  She does complain of a left temporal headache.  She denies fevers, chills or other complaints.  She does complain of pain with eye movement.  History reviewed. No pertinent past medical history.  There are no active problems to display for this patient.   History reviewed. No pertinent surgical history.  Allergies Patient has no known allergies.  Social History Social History   Tobacco Use  . Smoking status: Never Smoker  . Smokeless tobacco: Never Used  Substance Use Topics  . Alcohol use: Not Currently  . Drug use: Not Currently   Review of Systems Constitutional: Negative for fever. Eyes: Negative for vision changes.  Positive for pain with extraocular movements ENT:  Negative for congestion, sore throat Cardiovascular: Negative for chest pain. Respiratory: Negative for shortness of breath. Musculoskeletal: Negative for back pain. Skin: Positive for left orbital and periorbital swelling and erythema Neurological: Negative for headaches, focal weakness or numbness.  All systems negative/normal/unremarkable except as stated in the HPI  ____________________________________________   PHYSICAL EXAM:  VITAL SIGNS: ED Triage Vitals [07/28/18 1722]  Enc Vitals Group     BP (!) 120/99     Pulse Rate (!) 101     Resp 16     Temp 98.7 F (37.1  C)     Temp Source Oral     SpO2 99 %     Weight      Height      Head Circumference      Peak Flow      Pain Score 9     Pain Loc      Pain Edu?      Excl. in GC?    Constitutional: Alert and oriented.  No distress Eyes: Markedly chemosis of the left eye, mild photophobia, pain with range of motion of the left eye, moderate lid swelling is noted diffusely ENT   Head: Normocephalic and atraumatic.   Nose: No congestion/rhinnorhea.   Mouth/Throat: Mucous membranes are moist.   Neck: No stridor. Cardiovascular: Normal rate, regular rhythm. No murmurs, rubs, or gallops. Respiratory: Normal respiratory effort without tachypnea nor retractions. Breath sounds are clear and equal bilaterally. No wheezes/rales/rhonchi. Musculoskeletal: Nontender with normal range of motion in extremities. No lower extremity tenderness nor edema. Neurologic:  Normal speech and language. No gross focal neurologic deficits are appreciated.  Skin: Left periorbital edema with erythema Psychiatric: Mood and affect are normal. Speech and behavior are normal.  ____________________________________________  ED COURSE:  As part of my medical decision making, I reviewed the following data within the electronic MEDICAL RECORD NUMBER History obtained from family if available, nursing notes, old chart and ekg, as well as notes from prior ED visits. Patient presented for possible orbital cellulitis, we will assess with labs and imaging as indicated at this time.   Procedures ____________________________________________   LABS (pertinent positives/negatives)  Labs Reviewed  CBC - Abnormal; Notable for the  following components:      Result Value   RDW 15.7 (*)    Platelets 428 (*)    All other components within normal limits  BASIC METABOLIC PANEL    RADIOLOGY Images were viewed by me CT orbits IMPRESSION: 1. LEFT intraorbital cellulitis and conjunctivitis.  No abscess. 2. Mild sinusitis. 3. Acute  findings discussed with and reconfirmed by Dr.Addley Ballinger on 07/28/2018 at 6:42 pm.  ____________________________________________  DIFFERENTIAL DIAGNOSIS   Orbital cellulitis, periorbital cellulitis, allergic conjunctivitis  FINAL ASSESSMENT AND PLAN  Orbital cellulitis   Plan: The patient had presented for left eye pain and swelling. Patient's labs were reassuring. Patient's imaging surprisingly revealed orbital cellulitis and conjunctivitis.  We have started broad-spectrum antibiotics and I have discussed this with ophthalmology.  She will need to be admitted for IV antibiotics.   Ulice Dash, MD   Note: This note was generated in part or whole with voice recognition software. Voice recognition is usually quite accurate but there are transcription errors that can and very often do occur. I apologize for any typographical errors that were not detected and corrected.     Emily Filbert, MD 07/28/18 [id number]

## 2018-07-29 LAB — BASIC METABOLIC PANEL
ANION GAP: 5 (ref 5–15)
BUN: 9 mg/dL (ref 6–20)
CHLORIDE: 104 mmol/L (ref 98–111)
CO2: 28 mmol/L (ref 22–32)
Calcium: 8.4 mg/dL — ABNORMAL LOW (ref 8.9–10.3)
Creatinine, Ser: 0.86 mg/dL (ref 0.44–1.00)
GFR calc non Af Amer: >60 mL/min (ref >60)
Glucose, Bld: 81 mg/dL (ref 70–99)
POTASSIUM: 3.7 mmol/L (ref 3.5–5.1)
SODIUM: 137 mmol/L (ref 135–145)

## 2018-07-29 LAB — CBC
HCT: 36.1 % (ref 36.0–46.0)
HEMOGLOBIN: 11.6 g/dL — AB (ref 12.0–15.0)
MCH: 27 pg (ref 26.0–34.0)
MCHC: 32.1 g/dL (ref 30.0–36.0)
MCV: 84.1 fL (ref 80.0–100.0)
NRBC: 0 % (ref 0.0–0.2)
Platelets: 369 10*3/uL (ref 150–400)
RBC: 4.29 MIL/uL (ref 3.87–5.11)
RDW: 15.7 % — ABNORMAL HIGH (ref 11.5–15.5)
WBC: 5.9 10*3/uL (ref 4.0–10.5)

## 2018-07-29 MED ORDER — ERYTHROMYCIN 5 MG/GM OP OINT
TOPICAL_OINTMENT | Freq: Three times a day (TID) | OPHTHALMIC | Status: DC
  Administered 2018-07-29 (×2): via OPHTHALMIC
  Administered 2018-07-29: 1 via OPHTHALMIC
  Administered 2018-07-30 (×2): via OPHTHALMIC
  Filled 2018-07-29: qty 1

## 2018-07-29 MED ORDER — OXYCODONE-ACETAMINOPHEN 5-325 MG PO TABS
1.0000 | ORAL_TABLET | Freq: Four times a day (QID) | ORAL | Status: DC | PRN
  Administered 2018-07-29 (×2): 2 via ORAL
  Filled 2018-07-29 (×2): qty 2

## 2018-07-29 MED ORDER — SODIUM CHLORIDE 0.9 % IV SOLN
3.0000 g | Freq: Four times a day (QID) | INTRAVENOUS | Status: DC
  Administered 2018-07-29 – 2018-07-30 (×3): 3 g via INTRAVENOUS
  Filled 2018-07-29 (×6): qty 3

## 2018-07-29 MED ORDER — ZOLPIDEM TARTRATE 5 MG PO TABS
5.0000 mg | ORAL_TABLET | Freq: Once | ORAL | Status: AC
  Administered 2018-07-29: 5 mg via ORAL
  Filled 2018-07-29: qty 1

## 2018-07-29 MED ORDER — MORPHINE SULFATE (PF) 2 MG/ML IV SOLN
2.0000 mg | INTRAVENOUS | Status: DC | PRN
  Administered 2018-07-29 – 2018-07-30 (×4): 2 mg via INTRAVENOUS
  Filled 2018-07-29 (×4): qty 1

## 2018-07-29 NOTE — Progress Notes (Signed)
  Patient was seen at [facility] yesterday and, due to concern for orbital cellulitis, was sent to [facility] ER for imaging.  Her CT scan confirmed left orbital cellulitis, for which she is receiving iv antibiotic therapy.  Subjective:  Patient thinks that she's basically the same since yesterday. She still has pain in her eye. She still has trouble seeing.  Objective  Vision: 20/20 (right); 20/200 (left) - tested at near Pressure: 11 left (normal) Pupils: PERRL Motility: left eye has some restriction in upward and downward gaze Visual field: Limited by blurry vision  Exam of Left (Affected) Eye  Eyelids: Normal  Conjunctiva: 3+ Chemosis - conjunctiva overhanging lower lid Cornea: clear Anterior Chamber: Clear Iris/Pupil: wnl Lens: wnl  Fundus exam (left) - dilated at 12:45PM 07/29/18 Nerve: sharp Fundus: flat, wnl   Assessment/Plan:  Orbital Cellulitis (LEFT) - Exam and symptoms are stable today - CT scan (11/7) showed left orbital cellulitis - Patient continues to have significant chemosis, which likely causes her pain and limits her muscle movements - which can cause double vision.  Recs: 1. Continue current unasyn and vanc iv dosing for at least another 48 hours.  If patient is improving then, we can consider switching her to oral antibiotics and discharging patient with follow-up at [facility].  2. Erythromycin ointment 4x/day left eye  3. Artificial tears to left eye as needed for patient's comfort.  Ophthalmology will continue to follow closely.  Please page with any questions.   Elliot Cousin, MD Ophthalmology

## 2018-07-29 NOTE — Progress Notes (Signed)
[facility] [place] [facility]   PATIENT NAME: Lori Bryant    MR#:  [id number]  DATE OF BIRTH:  1985/09/24  SUBJECTIVE:   Patient came in with I puffiness pain and left frontal headache. Continues with some pain and water G.I. Has blurred vision in the left eye. REVIEW OF SYSTEMS:   Review of Systems  Constitutional: Negative for chills, fever and weight loss.  HENT: Negative for ear discharge, ear pain and nosebleeds.   Eyes: Positive for pain, discharge and redness. Negative for blurred vision.  Respiratory: Negative for sputum production, shortness of breath, wheezing and stridor.   Cardiovascular: Negative for chest pain, palpitations, orthopnea and PND.  Gastrointestinal: Negative for abdominal pain, diarrhea, nausea and vomiting.  Genitourinary: Negative for frequency and urgency.  Musculoskeletal: Negative for back pain and joint pain.  Neurological: Positive for headaches. Negative for sensory change, speech change, focal weakness and weakness.  Psychiatric/Behavioral: Negative for depression and hallucinations. The patient is not nervous/anxious.    Tolerating Diet:yes Tolerating PT: ambulatory  DRUG ALLERGIES:  No Known Allergies  VITALS:  Blood pressure (!) 93/56, pulse (!) 57, temperature 97.6 F (36.4 C), temperature source Oral, resp. rate 16, height 5\' 6"  (1.676 m), weight 80.8 kg, last menstrual period 07/28/2018, SpO2 97 %.  PHYSICAL EXAMINATION:   Physical Exam  GENERAL:  32 y.o.-year-old patient lying in the bed with no acute distress.  EYES: Pupils equal, round, reactive to light and accommodation. No scleral icterus. Extraocular muscles intact.  HEENT: Head atraumatic, normocephalic. Left eye swelling with congestion  watery discharge  NECK:  Supple, no jugular venous distention. No thyroid enlargement, no tenderness.  LUNGS: Normal breath sounds bilaterally, no wheezing, rales, rhonchi. No use of accessory muscles of  respiration.  CARDIOVASCULAR: S1, S2 normal. No murmurs, rubs, or gallops.  ABDOMEN: Soft, nontender, nondistended. Bowel sounds present. No organomegaly or mass.  EXTREMITIES: No cyanosis, clubbing or edema b/l.    NEUROLOGIC: Cranial nerves II through XII are intact. No focal Motor or sensory deficits b/l.   PSYCHIATRIC:  patient is alert and oriented x 3.  SKIN: No obvious rash, lesion, or ulcer.   LABORATORY PANEL:  CBC Recent Labs  Lab 07/29/18 0548  WBC 5.9  HGB 11.6*  HCT 36.1  PLT 369    Chemistries  Recent Labs  Lab 07/29/18 0548  NA 137  K 3.7  CL 104  CO2 28  GLUCOSE 81  BUN 9  CREATININE 0.86  CALCIUM 8.4*   Cardiac Enzymes No results for input(s): TROPONINI in the last 168 hours. RADIOLOGY:  Ct Orbits W Contrast  Result Date: 07/28/2018 CLINICAL DATA:  LEFT eye swelling and drainage for 2 days. Headache. EXAM: CT ORBITS WITH CONTRAST TECHNIQUE: Multidetector CT images was performed according to the standard protocol following intravenous contrast administration. CONTRAST:  75mL OMNIPAQUE IOHEXOL 300 MG/ML  SOLN COMPARISON:  None. FINDINGS: ORBITS: Intact ocular globes. Lenses are located. LEFT scleral enhancement and mild infiltration of the extraconal and retrobulbar are fat. Mildly enlarged an indistinct LEFT optic nerve sheath complex. Preservation of the orbital fat. Normal appearance of the extraocular muscles which are well located. Superior ophthalmic veins are not enlarged. VISUALIZED SINUSES: Mild LEFT maxillary sinus mucosal thickening with air-fluid level. Trace RIGHT maxillary sinus mucosal thickening. SOFT TISSUES/BONES: No significant soft tissue swelling, no subcutaneous gas or radiopaque foreign bodies. No destructive bony lesions. INTRACRANIAL CONTENTS: Normal. IMPRESSION: 1. LEFT intraorbital cellulitis and conjunctivitis.  No abscess. 2. Mild sinusitis.  3. Acute findings discussed with and reconfirmed by Dr.Williams on 07/28/2018 at 6:42 pm.  Electronically Signed   By: Awilda Metro M.D.   On: 07/28/2018 18:45   ASSESSMENT AND PLAN:  Lori Bryant is a 32 y.o. female with no significant past medical history who presents to the ED for possible orbital cellulitis.  Patient was sent from [facility] for eye eye swelling and pain.  Patient states essentially this started yesterday with eye itching.  Now the eyes swollen shut with drainage.  She does complain of a left temporal headache  1.  Left Orbital cellulitis  -cont with IV vancomycin and Unasyn -discussed with Dr. Lara Mulch ophthalmologist who will see patient in consultation -PRN IV Toradol and Percocet -erythromycin ointment TID per Dr. Lara Mulch -CT orbital left eye noted  2.  Miscellaneous Lovenox for DVT prophylaxis  Case discussed with Care Management/Social Worker. Management plans discussed with the patient, family and they are in agreement.  CODE STATUS: full  DVT Prophylaxis: as above  TOTAL TIME TAKING CARE OF THIS PATIENT: *30* minutes.  >50% time spent on counselling and coordination of care  POSSIBLE D/C IN 2-3 DAYS, DEPENDING ON CLINICAL CONDITION.  Note: This dictation was prepared with Dragon dictation along with smaller phrase technology. Any transcriptional errors that result from this process are unintentional.  Enedina Finner M.D on 07/29/2018 at 9:21 AM  Between 7am to 6pm - Pager - [phone]  After 6pm go to [email or web address] - password [email or web address] [facility]  [facility] [place] [facility]  Office  [phone]  CC: Primary care physician; Patient, No Pcp PerPatient ID: Lori Bryant, female   DOB: 07/08/1986, 32 y.o.   MRN: [id number]

## 2018-07-29 NOTE — Consult Note (Signed)
Pharmacy Antibiotic Note  Lori Bryant is a 32 y.o. female admitted on 07/28/2018 with orbital cellulitis/conjunctivitis.  Pharmacy has been consulted for Vancomycin/Unasyn dosing.  Plan:  Unasyn 3g IV every 6 hours.  Originally entered as unasyn 3 gm every 8 hours but adjusted to every 6 hours.  Continue Vancomycin 1250mg  q 12 hours with a 6 hour stacked dose and a target trough of 10-15.  Ke 0.097, VD 47.53, t 1/2 7.14, expected Cmin 13.01  Drawing VT prior to 4th dose.  Temp (24hrs), Avg:98.1 F (36.7 C), Min:97.6 F (36.4 C), Max:98.7 F (37.1 C)  Recent Labs  Lab 07/28/18 1730 07/29/18 0548  WBC 8.2 5.9  CREATININE 0.79 0.86    Estimated Creatinine Clearance: 102.5 mL/min (by C-G formula based on SCr of 0.86 mg/dL).    No Known Allergies  Antimicrobials this admission: Vancomycin 11/7 >>  Unasyn 11/7 >>   Dose adjustments this admission: Unasyn 3 gm IV every 8 hours to every 6 hours.  Microbiology results:   Thank you for allowing pharmacy to be a part of this patient's care.  Orinda Kenner, PharmD Clinical Pharmacist 07/29/2018 12:28 PM

## 2018-07-30 LAB — HIV ANTIBODY (ROUTINE TESTING W REFLEX): HIV Screen 4th Generation wRfx: NONREACTIVE

## 2018-07-30 LAB — CREATININE, SERUM
Creatinine, Ser: 0.75 mg/dL (ref 0.44–1.00)
GFR calc Af Amer: >60 mL/min (ref >60)
GFR calc non Af Amer: >60 mL/min (ref >60)

## 2018-07-30 LAB — VANCOMYCIN, TROUGH
VANCOMYCIN TR: 16 ug/mL (ref 15–20)
Vancomycin Tr: 21 ug/mL (ref 15–20)

## 2018-07-30 MED ORDER — IBUPROFEN 400 MG PO TABS
400.0000 mg | ORAL_TABLET | Freq: Three times a day (TID) | ORAL | Status: DC
  Administered 2018-07-30 (×2): 400 mg via ORAL
  Filled 2018-07-30 (×2): qty 1

## 2018-07-30 MED ORDER — SODIUM CHLORIDE 0.9 % IV SOLN
3.0000 g | Freq: Four times a day (QID) | INTRAVENOUS | Status: DC
  Administered 2018-07-30 (×2): 3 g via INTRAVENOUS
  Filled 2018-07-30 (×5): qty 3

## 2018-07-30 MED ORDER — SODIUM CHLORIDE 0.9 % IV SOLN: 3.0000 g | Freq: Four times a day (QID) | INTRAVENOUS | 0 refills | Status: DC

## 2018-07-30 MED ORDER — VANCOMYCIN HCL IN DEXTROSE 1-5 GM/200ML-% IV SOLN: 1000.0000 mg | Freq: Two times a day (BID) | INTRAVENOUS | 0 refills | Status: DC

## 2018-07-30 MED ORDER — VANCOMYCIN HCL IN DEXTROSE 1-5 GM/200ML-% IV SOLN
1000.0000 mg | Freq: Two times a day (BID) | INTRAVENOUS | Status: DC
  Administered 2018-07-30: 1000 mg via INTRAVENOUS
  Filled 2018-07-30 (×2): qty 200

## 2018-07-30 MED ORDER — ACETAMINOPHEN 160 MG/5ML PO SOLN
650.0000 mg | Freq: Four times a day (QID) | ORAL | Status: DC | PRN
  Administered 2018-07-30: 650 mg via ORAL
  Filled 2018-07-30 (×2): qty 20.3

## 2018-07-30 NOTE — Consult Note (Signed)
Pharmacy Antibiotic Note  Lori Bryant is a 32 y.o. female admitted on 07/28/2018 with orbital cellulitis/conjunctivitis.  Pharmacy has been consulted for Vancomycin/Unasyn dosing.  Plan: Continue Unasyn 3g IV every 6 hours.   Vanc "trough" 11/9 2212 16 ug/ml  Will continue decreased dose of 1g q12 and recheck VT prior to 4th additional dose 11/11 @ 1030   Temp (24hrs), Avg:98.1 F (36.7 C), Min:97.5 F (36.4 C), Max:99.1 F (37.3 C)  Recent Labs  Lab 07/28/18 1730 07/29/18 0548 07/30/18 1350  WBC 8.2 5.9  --   CREATININE 0.79 0.86  --   VANCOTROUGH  --   --  21*    Estimated Creatinine Clearance: 102.5 mL/min (by C-G formula based on SCr of 0.86 mg/dL).    Allergies  Allergen Reactions  . Percocet [Oxycodone-Acetaminophen] Itching    Antimicrobials this admission: Vancomycin 11/7 >>  Unasyn 11/7 >>   Dose adjustments this admission: Unasyn 3 gm IV every 8 hours to every 6 hours. Vancomycin 1250 q12h to 1000mg  q 12h    Thank you for allowing pharmacy to be a part of this patient's care.  Albina Billet, PharmD Clinical Pharmacist 07/30/2018 6:55 PM

## 2018-07-30 NOTE — Progress Notes (Addendum)
  Patient was seen at [facility] 2 days ago and, due to concern for orbital cellulitis, was sent to [facility] ER for imaging.  Her CT scan confirmed left orbital cellulitis, for which she is receiving iv antibiotic therapy (vancomycin and Unasyn iv abx)  Patient denies any recent trauma, exposure to animals.  Patient works at [employer]. Patient denies drug use, and in particular, injections of any kind. Patient says she has no idea how she developed the infection.  Subjective:  Patient thinks that she has improved a little since yesterday. The headache is a bit better, the vision is still very blurry.  Objective  Vision: 20/200 (left) - tested at near Pressure: 9 left (normal) Pupils: PERRL Motility: left eye has some restriction in upward and downward gaze Visual field: Limited by blurry vision  Exam of Left (Affected) Eye  Eyelids: Normal  Conjunctiva: 1+ Chemosis - decreased since yesterday Cornea: clear Anterior Chamber: Clear Iris/Pupil: wnl Lens: wnl  Fundus exam (left) - dilated at 7:15 PM 07/30/18 Nerve: sharp Fundus: large white debris in inferonasal quadrant, concerning for endophthalmitis.   Assessment/Plan:  Orbital Cellulitis (LEFT) ENDOPHTHALMITIS (LEFT) - CT scan (11/7) showed left orbital cellulitis - Although patient's chemosis is improving, she continues to have reduced vision. - Exam today is notable for large white infiltrates in her inferonasal quadrant, highly suspicious for endophthalmitis.  Recs:  1. Transfer patient to [facility]. Patient needs a higher level of care. She likely needs a tap and inject, and a vitrectomy performed by a retina specialist.  At [facility], she also likely needs continued unasyn and vanc dosing for her orbital cellulitis.  2. Given patient's new endophthalmitis, would repeat CT orbit (or maxillofacial) once at [facility].  2. Continue Erythromycin ointment 4x/day left eye to keep eye lubricated.  3. At  [facility], would consider ENT consult to evaluate patient's sinus disease, whether patient needs to have her sinuses drained, and whether they would recommend any changes to the antibiotics given her sinus disease.  4. Artificial tears to left eye as needed for patient's comfort.  Ophthalmology will work on arranging a transfer to [facility] emergency department.  Elliot Cousin, MD Ophthalmology

## 2018-07-30 NOTE — Plan of Care (Signed)
07/30/2018 11:44 AM  Patient continues to complain of left orbital and anterior head pain. Attending MD added scheduled Ibuprofen to help with sinus inflammation. Educated patient that the morphine could be causing headaches. Also, patient needs encouragement to ambulate outside the room. Patient refused to walk at this time and stated she walk in her room.   Madie Reno, RN

## 2018-07-30 NOTE — Progress Notes (Signed)
07/30/2018 10:03 AM  Contacted pharmacy to possibly alter 0930 Unasym dose.  Madie Reno, RN

## 2018-07-30 NOTE — Progress Notes (Signed)
Dr. Lara Mulch called, pt need to get transferred to [facility], he is getting accepting physician at [facility] and I will help with discharge summary.

## 2018-07-30 NOTE — Discharge Summary (Signed)
[facility] - [place] at [facility]   PATIENT NAME: Lori Bryant    MR#:  [id number]  DATE OF BIRTH:  03-11-1986  DATE OF ADMISSION:  07/28/2018 ADMITTING PHYSICIAN: Auburn Bilberry, MD  DATE OF DISCHARGE: 07/30/2018   PRIMARY CARE PHYSICIAN: Patient, No Pcp Per    ADMISSION DIAGNOSIS:  Orbital cellulitis on left [H05.012]  DISCHARGE DIAGNOSIS:  Active Problems:   Orbital cellulitis, left   Endophthalmitis  SECONDARY DIAGNOSIS:   Past Medical History:  Diagnosis Date  . No pertinent past medical history     HOSPITAL COURSE:   1. Transfer patient to [facility]. Patient needs a higher level of care. She likely needs a tap and inject, and a vitrectomy performed by a retina specialist.  At [facility], she also likely needs continued unasyn and vanc dosing for her orbital cellulitis.  2. Given patient's new endophthalmitis, would repeat CT orbit (or maxillofacial) once at [facility].  2. Continue Erythromycin ointment 4x/day left eye to keep eye lubricated.  3. At [facility], would consider ENT consult to evaluate patient's sinus disease, whether patient needs to have her sinuses drained, and whether they would recommend any changes to the antibiotics given her sinus disease.  4. Artificial tears to left eye as needed for patient's comfort.   DISCHARGE CONDITIONS:   Stable.  CONSULTS OBTAINED:  Treatment Team:  Elliot Cousin, MD  DRUG ALLERGIES:   Allergies  Allergen Reactions  . Percocet [Oxycodone-Acetaminophen] Itching    DISCHARGE MEDICATIONS:   Allergies as of 07/30/2018      Reactions   Percocet [oxycodone-acetaminophen] Itching      Medication List    TAKE these medications   Ampicillin-Sulbactam 3 g in sodium chloride 0.9 % 100 mL Inject 3 g into the vein every 6 (six) hours. Start taking on:  07/31/2018   vancomycin 1-5 GM/200ML-% Soln Commonly known as:  VANCOCIN Inject 200 mLs (1,000 mg total)  into the vein every 12 (twelve) hours.        DISCHARGE INSTRUCTIONS:   Follow with [facility] ophthalmology.  If you experience worsening of your admission symptoms, develop shortness of breath, life threatening emergency, suicidal or homicidal thoughts you must seek medical attention immediately by calling 911 or calling your MD immediately  if symptoms less severe.  You Must read complete instructions/literature along with all the possible adverse reactions/side effects for all the Medicines you take and that have been prescribed to you. Take any new Medicines after you have completely understood and accept all the possible adverse reactions/side effects.   Please note  You were cared for by a hospitalist during your hospital stay. If you have any questions about your discharge medications or the care you received while you were in the hospital after you are discharged, you can call the unit and asked to speak with the hospitalist on call if the hospitalist that took care of you is not available. Once you are discharged, your primary care physician will handle any further medical issues. Please note that NO REFILLS for any discharge medications will be authorized once you are discharged, as it is imperative that you return to your primary care physician (or establish a relationship with a primary care physician if you do not have one) for your aftercare needs so that they can reassess your need for medications and monitor your lab values.    Today   CHIEF COMPLAINT:   Chief Complaint  Patient presents with  .  Eye Problem    HISTORY OF PRESENT ILLNESS:  Lori Bryant  is a 32 y.o. female with a known history of no medical problems who is presenting to the ED with swelling and pain in the left eye.  Patient was seen by Dr. Delorise Shiner at [facility] in the office who referred her to the ED for CT scan of her orbits.  Patient states that the symptoms started yesterday.  She is also having  some blurred vision.  cT scan showed left intraorbital cellulitis and conjunctivitis.  ER physician called ophthalmologist who recommended admission and IV antibiotics.   VITAL SIGNS:  Blood pressure (!) 100/52, pulse 69, temperature 98.1 F (36.7 C), temperature source Oral, resp. rate 16, height 5\' 6"  (1.676 m), weight 80.8 kg, last menstrual period 07/28/2018, SpO2 100 %.  I/O:    Intake/Output Summary (Last 24 hours) at 07/30/2018 2121 Last data filed at 07/30/2018 2031 Gross per 24 hour  Intake 949.49 ml  Output 1900 ml  Net -950.51 ml    PHYSICAL EXAMINATION:  GENERAL:  32 y.o.-year-old patient lying in the bed with no acute distress.  EYES: Pupils equal, round, reactive to light and accommodation. No scleral icterus. Extraocular muscles intact.  HEENT: Head atraumatic, normocephalic. Left eye swelling with congestion  NECK:  Supple, no jugular venous distention. No thyroid enlargement, no tenderness.  LUNGS: Normal breath sounds bilaterally, no wheezing, rales, rhonchi. No use of accessory muscles of respiration.  CARDIOVASCULAR: S1, S2 normal. No murmurs, rubs, or gallops.  ABDOMEN: Soft, nontender, nondistended. Bowel sounds present. No organomegaly or mass.  EXTREMITIES: No cyanosis, clubbing or edema b/l.    NEUROLOGIC: Cranial nerves II through XII are intact. No focal Motor or sensory deficits b/l.   PSYCHIATRIC:  patient is alert and oriented x 3.  SKIN: No obvious rash, lesion, or ulcer.   DATA REVIEW:   CBC Recent Labs  Lab 07/29/18 0548  WBC 5.9  HGB 11.6*  HCT 36.1  PLT 369    Chemistries  Recent Labs  Lab 07/29/18 0548  NA 137  K 3.7  CL 104  CO2 28  GLUCOSE 81  BUN 9  CREATININE 0.86  CALCIUM 8.4*    Cardiac Enzymes No results for input(s): TROPONINI in the last 168 hours.  Microbiology Results  No results found for this or any previous visit.  RADIOLOGY:  No results found.  EKG:  No orders found for this or any previous  visit.    Management plans discussed with the patient, family and they are in agreement.  CODE STATUS:     Code Status Orders  (From admission, onward)         Start     Ordered   07/28/18 1922  Full code  Continuous     07/28/18 1922        Code Status History    This patient has a current code status but no historical code status.      TOTAL TIME TAKING CARE OF THIS PATIENT: 40 minutes.    Altamese Dilling M.D on 07/30/2018 at 9:21 PM  Between 7am to 6pm - Pager - [phone]  After 6pm go to [email or web address] - password [email or web address]  [facility] [place] [facility]  Office  [phone]  CC: Primary care physician; Patient, No Pcp Per   Note: This dictation was prepared with Dragon dictation along with smaller phrase technology. Any transcriptional errors that result from this process are unintentional.

## 2018-07-30 NOTE — Progress Notes (Signed)
[facility] - [place] at [facility]   PATIENT NAME: Lori Bryant    MR#:  [id number]  DATE OF BIRTH:  [birth year]  SUBJECTIVE:    left sided headache. Tolerating po diet REVIEW OF SYSTEMS:   Review of Systems  Constitutional: Negative for chills, fever and weight loss.  HENT: Negative for ear discharge, ear pain and nosebleeds.   Eyes: Positive for pain, discharge and redness. Negative for blurred vision.  Respiratory: Negative for sputum production, shortness of breath, wheezing and stridor.   Cardiovascular: Negative for chest pain, palpitations, orthopnea and PND.  Gastrointestinal: Negative for abdominal pain, diarrhea, nausea and vomiting.  Genitourinary: Negative for frequency and urgency.  Musculoskeletal: Negative for back pain and joint pain.  Neurological: Positive for headaches. Negative for sensory change, speech change, focal weakness and weakness.  Psychiatric/Behavioral: Negative for depression and hallucinations. The patient is not nervous/anxious.    Tolerating Diet:yes Tolerating PT: ambulatory  DRUG ALLERGIES:   Allergies  Allergen Reactions  . Percocet [Oxycodone-Acetaminophen] Itching    VITALS:  Blood pressure (!) 89/53, pulse 75, temperature (!) 97.5 F (36.4 C), temperature source Oral, resp. rate 16, height 5\' 6"  (1.676 m), weight 80.8 kg, last menstrual period 07/28/2018, SpO2 99 %.  PHYSICAL EXAMINATION:   Physical Exam  GENERAL:  32 y.o.-year-old patient lying in the bed with no acute distress.  EYES: Pupils equal, round, reactive to light and accommodation. No scleral icterus. Extraocular muscles intact.  HEENT: Head atraumatic, normocephalic. Left eye swelling with congestion  NECK:  Supple, no jugular venous distention. No thyroid enlargement, no tenderness.  LUNGS: Normal breath sounds bilaterally, no wheezing, rales, rhonchi. No use of accessory muscles of respiration.  CARDIOVASCULAR: S1, S2 normal. No murmurs,  rubs, or gallops.  ABDOMEN: Soft, nontender, nondistended. Bowel sounds present. No organomegaly or mass.  EXTREMITIES: No cyanosis, clubbing or edema b/l.    NEUROLOGIC: Cranial nerves II through XII are intact. No focal Motor or sensory deficits b/l.   PSYCHIATRIC:  patient is alert and oriented x 3.  SKIN: No obvious rash, lesion, or ulcer.   LABORATORY PANEL:  CBC Recent Labs  Lab 07/29/18 0548  WBC 5.9  HGB 11.6*  HCT 36.1  PLT 369    Chemistries  Recent Labs  Lab 07/29/18 0548  NA 137  K 3.7  CL 104  CO2 28  GLUCOSE 81  BUN 9  CREATININE 0.86  CALCIUM 8.4*   Cardiac Enzymes No results for input(s): TROPONINI in the last 168 hours. RADIOLOGY:  Ct Orbits W Contrast  Result Date: 07/28/2018 CLINICAL DATA:  LEFT eye swelling and drainage for 2 days. Headache. EXAM: CT ORBITS WITH CONTRAST TECHNIQUE: Multidetector CT images was performed according to the standard protocol following intravenous contrast administration. CONTRAST:  75mL OMNIPAQUE IOHEXOL 300 MG/ML  SOLN COMPARISON:  None. FINDINGS: ORBITS: Intact ocular globes. Lenses are located. LEFT scleral enhancement and mild infiltration of the extraconal and retrobulbar are fat. Mildly enlarged an indistinct LEFT optic nerve sheath complex. Preservation of the orbital fat. Normal appearance of the extraocular muscles which are well located. Superior ophthalmic veins are not enlarged. VISUALIZED SINUSES: Mild LEFT maxillary sinus mucosal thickening with air-fluid level. Trace RIGHT maxillary sinus mucosal thickening. SOFT TISSUES/BONES: No significant soft tissue swelling, no subcutaneous gas or radiopaque foreign bodies. No destructive bony lesions. INTRACRANIAL CONTENTS: Normal. IMPRESSION: 1. LEFT intraorbital cellulitis and conjunctivitis.  No abscess. 2. Mild sinusitis. 3. Acute findings discussed with and reconfirmed by Dr.Williams on 07/28/2018 at  6:42 pm. Electronically Signed   By: Awilda Metro M.D.   On:  07/28/2018 18:45   ASSESSMENT AND PLAN:  Lori Bryant is a 32 y.o. female with no significant past medical history who presents to the ED for possible orbital cellulitis.  Patient was sent from [place] for eye eye swelling and pain.  Patient states essentially this started yesterday with eye itching.  Now the eyes swollen shut with drainage.  She does complain of a left temporal headache  1.  Left Orbital cellulitis  -cont with IV vancomycin and Unasyn -appreciate Dr. Bunnie Pion  ophthalmologist input -PRN IV morphine and po Percocet prn. -will do scheduled Ibuprofen 400 mg tid -erythromycin ointment TID per Dr. Lara Mulch -CT orbital left eye noted  2.  Miscellaneous Lovenox for DVT prophylaxis  Case discussed with Care Management/Social Worker. Management plans discussed with the patient, family and they are in agreement.  CODE STATUS: full  DVT Prophylaxis: as above  TOTAL TIME TAKING CARE OF THIS PATIENT: 25 minutes.  >50% time spent on counselling and coordination of care  POSSIBLE D/C IN 1-2  DAYS, DEPENDING ON CLINICAL CONDITION.  Note: This dictation was prepared with Dragon dictation along with smaller phrase technology. Any transcriptional errors that result from this process are unintentional.  Enedina Finner M.D on 07/30/2018 at 7:50 AM  Between 7am to 6pm - Pager - [phone]  After 6pm go to [email or web address] - password [email or web address] [facility]  [facility]  Office  [phone]  CC: Primary care physician; Patient, No Pcp PerPatient ID: Lori Bryant, female   DOB: [birth year], 32 y.o.   MRN: [id number]

## 2018-07-30 NOTE — Consult Note (Addendum)
Pharmacy Antibiotic Note  Lori Bryant is a 32 y.o. female admitted on 07/28/2018 with orbital cellulitis/conjunctivitis.  Pharmacy has been consulted for Vancomycin/Unasyn dosing.  Plan: Vanc hung late and level drawn only 9hr after last dose was hung. Level resulted at 21. Although this does not represent a true trough, goal is 10-15. Therefore I will decrease the dose to 1g q 12 hr. However RN has already hung next dose. She will stop the infusion now (about 30 min in). I will check another level at 2200 to make sure pt is clearing and level is <15  Ke=0.08 Vd 56.5 T1/2 8hr  Unasyn 3g IV every 6 hours.  Originally entered as unasyn 3 gm every 8 hours but adjusted to every 6 hours.    Temp (24hrs), Avg:98.1 F (36.7 C), Min:97.5 F (36.4 C), Max:99.1 F (37.3 C)  Recent Labs  Lab 07/28/18 1730 07/29/18 0548 07/30/18 1350  WBC 8.2 5.9  --   CREATININE 0.79 0.86  --   VANCOTROUGH  --   --  21*    Estimated Creatinine Clearance: 102.5 mL/min (by C-G formula based on SCr of 0.86 mg/dL).    Allergies  Allergen Reactions  . Percocet [Oxycodone-Acetaminophen] Itching    Antimicrobials this admission: Vancomycin 11/7 >>  Unasyn 11/7 >>   Dose adjustments this admission: Unasyn 3 gm IV every 8 hours to every 6 hours.  Microbiology results:   Thank you for allowing pharmacy to be a part of this patient's care.  Olene Floss, PharmD Clinical Pharmacist 07/30/2018 2:45 PM

## 2018-07-31 NOTE — Progress Notes (Signed)
Pt received transfer orders to [facility] hospital. Primary RN called and gave report to Jess on the [facility] Rm [id number] with all questions answered. [facility] called.  Pt was discharge with IV intact. Belongings sent with pt via EMS.

## 2018-08-07 ENCOUNTER — Other Ambulatory Visit: Payer: Self-pay

## 2018-08-07 ENCOUNTER — Inpatient Hospital Stay
Admission: AD | Admit: 2018-08-07 | Discharge: 2018-08-09 | DRG: 125 | Disposition: A | Discharge status: Home Health | Payer: Self-pay | Source: Other Acute Inpatient Hospital | Attending: Internal Medicine | Admitting: Internal Medicine

## 2018-08-07 DIAGNOSIS — A5271 Late syphilitic oculopathy: Secondary | ICD-10-CM | POA: Diagnosis present

## 2018-08-07 DIAGNOSIS — G971 Other reaction to spinal and lumbar puncture: Secondary | ICD-10-CM | POA: Diagnosis present

## 2018-08-07 DIAGNOSIS — A5143 Secondary syphilitic oculopathy: Principal | ICD-10-CM | POA: Diagnosis present

## 2018-08-07 DIAGNOSIS — Y844 Aspiration of fluid as the cause of abnormal reaction of the patient, or of later complication, without mention of misadventure at the time of the procedure: Secondary | ICD-10-CM | POA: Diagnosis present

## 2018-08-07 DIAGNOSIS — A5149 Other secondary syphilitic conditions: Secondary | ICD-10-CM | POA: Diagnosis present

## 2018-08-07 DIAGNOSIS — Z79899 Other long term (current) drug therapy: Secondary | ICD-10-CM

## 2018-08-07 DIAGNOSIS — Z6829 Body mass index (BMI) 29.0-29.9, adult: Secondary | ICD-10-CM

## 2018-08-07 DIAGNOSIS — E663 Overweight: Secondary | ICD-10-CM | POA: Diagnosis present

## 2018-08-07 DIAGNOSIS — Z885 Allergy status to narcotic agent status: Secondary | ICD-10-CM

## 2018-08-07 DIAGNOSIS — Z9049 Acquired absence of other specified parts of digestive tract: Secondary | ICD-10-CM

## 2018-08-07 MED ORDER — PENICILLIN G POTASSIUM 20000000 UNITS IJ SOLR: 4.0000 10*6.[IU] | INTRAVENOUS | Status: DC

## 2018-08-07 MED ORDER — ENOXAPARIN SODIUM 40 MG/0.4ML ~~LOC~~ SOLN
40.0000 mg | SUBCUTANEOUS | Status: DC
  Filled 2018-08-07 (×2): qty 0.4

## 2018-08-07 MED ORDER — IBUPROFEN 600 MG PO TABS
600.00 | ORAL_TABLET | ORAL | Status: DC
Start: ? — End: 2018-08-07

## 2018-08-07 MED ORDER — BUTALBITAL-APAP-CAFFEINE 50-325-40 MG PO TABS
1.00 | ORAL_TABLET | ORAL | Status: DC
Start: ? — End: 2018-08-07

## 2018-08-07 MED ORDER — ATROPINE SULFATE 1 % OP SOLN
1.0000 [drp] | Freq: Two times a day (BID) | OPHTHALMIC | Status: DC
  Administered 2018-08-07 – 2018-08-09 (×5): 1 [drp] via OPHTHALMIC
  Filled 2018-08-07: qty 2

## 2018-08-07 MED ORDER — DOCUSATE SODIUM 100 MG PO CAPS
100.0000 mg | ORAL_CAPSULE | Freq: Two times a day (BID) | ORAL | Status: DC
  Administered 2018-08-07 – 2018-08-08 (×2): 100 mg via ORAL
  Filled 2018-08-07 (×4): qty 1

## 2018-08-07 MED ORDER — PREDNISOLONE ACETATE 1 % OP SUSP
1.00 | OPHTHALMIC | Status: DC
Start: 2018-08-07 — End: 2018-08-07

## 2018-08-07 MED ORDER — ONDANSETRON 4 MG PO TBDP
4.00 | ORAL_TABLET | ORAL | Status: DC
Start: ? — End: 2018-08-07

## 2018-08-07 MED ORDER — GENERIC EXTERNAL MEDICATION
4.00 | Status: DC
Start: 2018-08-07 — End: 2018-08-07

## 2018-08-07 MED ORDER — ACETAMINOPHEN 325 MG PO TABS: 650.0000 mg | ORAL_TABLET | Freq: Four times a day (QID) | ORAL | Status: DC | PRN

## 2018-08-07 MED ORDER — ONDANSETRON HCL 4 MG PO TABS
4.0000 mg | ORAL_TABLET | Freq: Four times a day (QID) | ORAL | Status: DC | PRN
  Filled 2018-08-07: qty 1

## 2018-08-07 MED ORDER — ALUM & MAG HYDROXIDE-SIMETH 400-400-40 MG/5ML PO SUSP
30.00 | ORAL | Status: DC
Start: ? — End: 2018-08-07

## 2018-08-07 MED ORDER — PENICILLIN G POTASSIUM 5000000 UNITS IJ SOLR
INTRAVENOUS | Status: DC
  Administered 2018-08-07 – 2018-08-08 (×3): via INTRAVENOUS
  Filled 2018-08-07 (×7): qty 500

## 2018-08-07 MED ORDER — PREDNISOLONE ACETATE 1 % OP SUSP
1.0000 [drp] | OPHTHALMIC | Status: DC
  Administered 2018-08-07 – 2018-08-09 (×19): 1 [drp] via OPHTHALMIC
  Filled 2018-08-07: qty 1

## 2018-08-07 MED ORDER — ONDANSETRON HCL 4 MG/2ML IJ SOLN
4.0000 mg | Freq: Four times a day (QID) | INTRAMUSCULAR | Status: DC | PRN
  Administered 2018-08-07 – 2018-08-08 (×3): 4 mg via INTRAVENOUS
  Filled 2018-08-07 (×4): qty 2

## 2018-08-07 MED ORDER — ACETAMINOPHEN 325 MG PO TABS
650.00 | ORAL_TABLET | ORAL | Status: DC
Start: ? — End: 2018-08-07

## 2018-08-07 MED ORDER — ACETAMINOPHEN 650 MG RE SUPP: 650.0000 mg | Freq: Four times a day (QID) | RECTAL | Status: DC | PRN

## 2018-08-07 MED ORDER — ATROPINE SULFATE 1 % OP SOLN
1.00 | OPHTHALMIC | Status: DC
Start: 2018-08-07 — End: 2018-08-07

## 2018-08-07 MED ORDER — BUTALBITAL-APAP-CAFFEINE 50-325-40 MG PO TABS
1.0000 | ORAL_TABLET | Freq: Four times a day (QID) | ORAL | Status: DC | PRN
  Administered 2018-08-07 – 2018-08-09 (×4): 1 via ORAL
  Filled 2018-08-07 (×6): qty 1

## 2018-08-07 MED ORDER — PENICILLIN G 3 MILLION UNITS IVPB - SIMPLE MED
3.0000 10*6.[IU] | INTRAVENOUS | Status: DC
  Administered 2018-08-07 (×2): 3 10*6.[IU] via INTRAVENOUS
  Filled 2018-08-07: qty 100
  Filled 2018-08-07: qty 3
  Filled 2018-08-07 (×4): qty 100
  Filled 2018-08-07: qty 3

## 2018-08-07 MED ORDER — PANTOPRAZOLE SODIUM 40 MG PO TBEC
40.00 | DELAYED_RELEASE_TABLET | ORAL | Status: DC
Start: 2018-08-07 — End: 2018-08-07

## 2018-08-07 NOTE — Plan of Care (Signed)

## 2018-08-07 NOTE — H&P (Signed)
Lori Bryant is an 32 y.o. female.   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  No cerebral involvement.  [facility] the patient complained only of headache.  Past Medical History:  Diagnosis Date  . No pertinent past medical history     Past Surgical History:  Procedure Laterality Date  . denies      Family History  Family history unknown: Yes   Social History:  reports that she has never smoked. She has never used smokeless tobacco. She reports that she drank alcohol. She reports that she has current or past drug history.  Allergies:  Allergies  Allergen Reactions  . Percocet [Oxycodone-Acetaminophen] Itching    Medications Prior to Admission  Medication Sig Dispense Refill  . acetaminophen (TYLENOL) 325 MG tablet Take 650 mg by mouth every 4 (four) hours as needed for mild pain, fever or headache.    Marland Kitchen. alum & mag hydroxide-simeth (MAALOX/MYLANTA) 200-200-20 MG/5ML suspension Take 30 mLs by mouth every 6 (six) hours as needed for indigestion or heartburn.    Marland Kitchen. atropine 1 % ophthalmic solution Place 1 drop into the left eye 2 (two) times daily.    . butalbital-acetaminophen-caffeine (FIORICET, ESGIC) 50-325-40 MG tablet Take 1 tablet by mouth every 6 (six) hours as needed for headache.    . ibuprofen (ADVIL,MOTRIN) 600 MG tablet Take 600 mg by mouth every 6 (six) hours as needed for fever, headache or mild pain.    Marland Kitchen. ondansetron (ZOFRAN-ODT) 4 MG disintegrating tablet Take 4 mg by mouth every 12 (twelve) hours as needed for nausea or vomiting.    . pantoprazole (PROTONIX) 40 MG tablet Take 40 mg by mouth daily.    . penicillin G IVPB Inject 4 Million Units into the vein every 4  (four) hours.    . prednisoLONE acetate (PRED FORTE) 1 % ophthalmic suspension Place 1 drop into the left eye 6 (six) times daily.      No results found for this or any previous visit (from the past 48 hour(s)). No results found.  Review of Systems  Constitutional: Negative for chills and fever.  HENT: Negative for sore throat and tinnitus.   Eyes: Negative for blurred vision and redness.  Respiratory: Negative for cough and shortness of breath.   Cardiovascular: Negative for chest pain, palpitations, orthopnea and PND.  Gastrointestinal: Negative for abdominal pain, diarrhea, nausea and vomiting.  Genitourinary: Negative for dysuria, frequency and urgency.  Musculoskeletal: Negative for joint pain and myalgias.  Skin: Negative for rash.       No lesions  Neurological: Positive for headaches. Negative for speech change, focal weakness and weakness.  Endo/Heme/Allergies: Does not bruise/bleed easily.       No temperature intolerance  Psychiatric/Behavioral: Negative for depression and suicidal ideas.    Blood pressure (!) 104/54, pulse 68, temperature (!) 97.5 F (36.4 C), temperature source Oral, resp. rate 16, height 5\' 6"  (1.676 m), weight 82 kg, last menstrual period 07/28/2018, SpO2 98 %. Physical Exam  Vitals reviewed. Constitutional: She is oriented to person, place, and time. She appears well-developed and well-nourished. No distress.  HENT:  Head: Normocephalic and atraumatic.  Mouth/Throat: Oropharynx is clear and moist.  Eyes: Pupils are equal,  round, and reactive to light. Conjunctivae and EOM are normal. No scleral icterus.  Neck: Normal range of motion. Neck supple. No JVD present. No tracheal deviation present. No thyromegaly present.  Cardiovascular: Normal rate, regular rhythm and normal heart sounds. Exam reveals no gallop and no friction rub.  No murmur heard. Respiratory: Effort normal and breath sounds normal.  GI: Soft. Bowel sounds are normal. She exhibits  no distension. There is no tenderness.  Genitourinary:  Genitourinary Comments: Deferred  Musculoskeletal: Normal range of motion. She exhibits no edema.  Lymphadenopathy:    She has no cervical adenopathy.  Neurological: She is alert and oriented to person, place, and time. No cranial nerve deficit. She exhibits normal muscle tone.  Skin: Skin is warm and dry. No rash noted. No erythema.  Psychiatric: She has a normal mood and affect. Her behavior is normal. Judgment and thought content normal.     Assessment/Plan This is a 32 year old female admitted for ocular syphilis. 1.  Syphilis: Ocular.  Penicillin ordered 3,000,000 units 6 times a day per pharmacy for a total of 18,000,000 units daily per recommendations 2.  Uveitis: continue prednisone drops as well as atropine drops 3.  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  Manage severe pain with Fioricet 4.  Overweight: BMI is 29.1 MI: Encourage healthy diet and exercise 5.  DVT prophylaxis: Lovenox 6.  GI prophylaxis: None The patient is a full code.  Time spent on admission orders and patient care approximately 45 minutes  Arnaldo Natal, MD 08/07/2018, 8:50 AM

## 2018-08-07 NOTE — Progress Notes (Signed)
[facility] - [place] at [facility]   PATIENT NAME: Lori Bryant    MR#:  [id number]  DATE OF BIRTH:  11/04/1985  SUBJECTIVE:  CHIEF COMPLAINT: Patient was transferred from [facility] regarding ocular syphilis on 07/30/2018 and got [facility] back [facility] last night for continuity of  care.  Reports left eye blurry vision is improving and feels better.  REVIEW OF SYSTEMS:  CONSTITUTIONAL: No fever, fatigue or weakness.  EYES: No blurred or double vision.  EARS, NOSE, AND THROAT: No tinnitus or ear pain.  RESPIRATORY: No cough, shortness of breath, wheezing or hemoptysis.  CARDIOVASCULAR: No chest pain, orthopnea, edema.  GASTROINTESTINAL: No nausea, vomiting, diarrhea or abdominal pain.  GENITOURINARY: No dysuria, hematuria.  ENDOCRINE: No polyuria, nocturia,  HEMATOLOGY: No anemia, easy bruising or bleeding SKIN: No rash or lesion. MUSCULOSKELETAL: No joint pain or arthritis.   NEUROLOGIC: No tingling, numbness, weakness.  PSYCHIATRY: No anxiety or depression.   DRUG ALLERGIES:   Allergies  Allergen Reactions  . Percocet [Oxycodone-Acetaminophen] Itching    VITALS:  Blood pressure (!) 105/57, pulse 66, temperature (!) 97.4 F (36.3 C), temperature source Oral, resp. rate 16, height 5\' 6"  (1.676 m), weight 82 kg, last menstrual period 07/28/2018, SpO2 100 %.  PHYSICAL EXAMINATION:  GENERAL:  32 y.o.-year-old patient lying in the bed with no acute distress.  EYES: Pupils equal, round, reactive to light and accommodation.  Left eye with blurry vision no scleral icterus. Extraocular muscles intact.  HEENT: Head atraumatic, normocephalic. Oropharynx and nasopharynx clear.  NECK:  Supple, no jugular venous distention. No thyroid enlargement, no tenderness.  LUNGS: Normal breath sounds bilaterally, no wheezing, rales,rhonchi or crepitation. No use of accessory muscles of respiration.  CARDIOVASCULAR: S1, S2 normal. No  murmurs, rubs, or gallops.  ABDOMEN: Soft, nontender, nondistended. Bowel sounds present. No organomegaly or mass.  EXTREMITIES: No pedal edema, cyanosis, or clubbing.  NEUROLOGIC: Cranial nerves II through XII are intact. Muscle strength 5/5 in all extremities. Sensation intact. Gait not checked.  PSYCHIATRIC: The patient is alert and oriented x 3.  SKIN: No obvious rash, lesion, or ulcer.    LABORATORY PANEL:   CBC No results for input(s): WBC, HGB, HCT, PLT in the last 168 hours. ------------------------------------------------------------------------------------------------------------------  Chemistries  No results for input(s): NA, K, CL, CO2, GLUCOSE, BUN, CREATININE, CALCIUM, MG, AST, ALT, ALKPHOS, BILITOT in the last 168 hours.  Invalid input(s): GFRCGP ------------------------------------------------------------------------------------------------------------------  Cardiac Enzymes No results for input(s): TROPONINI in the last 168 hours. ------------------------------------------------------------------------------------------------------------------  RADIOLOGY:  No results found.  EKG:  No orders found for this or any previous visit.  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 1.  Syphilis: Ocular.  Penicillin G ordered dose changed to4,000,000 units IV every 4 hours for 14 days by infectious disease .  Patient needs PICC line if she can be discharged with IV penicillin Appreciate ID Dr. Ilsa IhaSnyder recommendations  At the end of 14 days we need to arrange the health department to administer a single dose of penicillin G benzathine 2.4 MU IM x1  Follow-up with Dr. Joylene Draftavisankar tomorrow 2.  Uveitis: continue prednisone drops as well as atropine drops 3.  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  Fioricet is helping continue the same as needed  4.  Overweight: BMI is 29.1 MI: Encourage healthy diet and exercise 5.  DVT  prophylaxis: Lovenox  Consult case management  Full code All  the records are reviewed and case discussed with Care Management/Social Workerr. Management plans discussed with the patient, family and they are in agreement.  CODE STATUS: fc   TOTAL TIME TAKING CARE OF THIS PATIENT: 35 minutes.   POSSIBLE D/C IN 2 DAYS, DEPENDING ON CLINICAL CONDITION.  Note: This dictation was prepared with Dragon dictation along with smaller phrase technology. Any transcriptional errors that result from this process are unintentional.   Ramonita Lab M.D on 08/07/2018 at 3:06 PM  Between 7am to 6pm - Pager - [phone] After 6pm go to [email or web address] - password [email or web address]  [facility]  Office  [phone]  CC: Primary care physician; Patient, No Pcp Per

## 2018-08-07 NOTE — Consult Note (Signed)
    [facility]  Total days of antibiotics 6               Reason for Consult: ocular syphilis    Referring Physician: gouru  Active Problems:   Ocular syphilis   Secondary syphilis    HPI: Lori Bryant is a 32 y.o. female with no significant past medical history who started to have left eye pain and swelling on 11/6. She was initially seen at the [facility] by Dr Kelle Dartingharrar on 11/7 who directed her to the ED for concern of orbital cellulitis, panuveitis, and started empirically on vancomycin plus amp/sub with CT max[facility] with Circumferential scleral thickening with mild inflammatory stranding involving the intraconal fat just posterior to the left globe. No focal fluid collection or abscess was identified, suggestive of cellulitis and left-sided endophthalmitis, without evidence of abscess. [facility] for further optho management and ID evaluation.   Initial anterior chamber tap culture without PML and no organisms and cx negative at 72hr, as well as Blood culture results negative x72hours. Stopped vancomycin on 11/13. 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 Interestingly, has sx of secondary syphilis with rash to palms and soles. Serology also showed  positive ANA in a speckled pattern 1: 160. Other testing including for etiologies toxoplasmosis, Bartonella, HIV were negative. Patient was transitioned to IV penicillin with continued improvement in her eye. Marland Kitchen. Ophthalmology followed pt and continued to monitor vision which is improving. On 11/17, She was transferred [facility] for completion of treatment. Eye pain improved but still having headache.   Past Medical History:  Diagnosis Date  . No pertinent past medical history     Allergies:  Allergies  Allergen Reactions  .  Percocet [Oxycodone-Acetaminophen] Itching     MEDICATIONS: . atropine  1 drop Left Eye BID  . docusate sodium  100 mg Oral BID  . enoxaparin (LOVENOX) injection  40 mg Subcutaneous Q24H  . pencillin G potassium IV  3 Million Units Intravenous Q4H  . prednisoLONE acetate  1 drop Left Eye Q2H while awake    Social History   Tobacco Use  . Smoking status: Never Smoker  . Smokeless tobacco: Never Used  Substance Use Topics  . Alcohol use: Not Currently  . Drug use: Not Currently    Family History  Family history unknown: Yes      Assessment/Plan:  Ocular syphilis,currently day 6 of treatment  Plan is to continue PCN G 4,000,000 units IV every 4 hours for 14 days, last dose due 11/25. ( I have increased the dose that she is currently on)  At the end of 14 days, we will need to arrange via health dept, to administer a single dose of PCN G benzathine 2.4MU  IM x1.   Pt will need to f/u with infectious disease in clinic in 3-6 months for further management.   -  conitnue atropine BID and predforte 6x daily for now. Also recommend follow up with local ophtho  - Dr Rivka Saferavishankar to see tomorrow.  Duke Salviaynthia B. Drue SecondSnider MD MPH [facility] [phone]

## 2018-08-08 ENCOUNTER — Inpatient Hospital Stay: Payer: Self-pay

## 2018-08-08 DIAGNOSIS — H209 Unspecified iridocyclitis: Secondary | ICD-10-CM

## 2018-08-08 DIAGNOSIS — Z9049 Acquired absence of other specified parts of digestive tract: Secondary | ICD-10-CM

## 2018-08-08 DIAGNOSIS — A5149 Other secondary syphilitic conditions: Secondary | ICD-10-CM

## 2018-08-08 DIAGNOSIS — Z885 Allergy status to narcotic agent status: Secondary | ICD-10-CM

## 2018-08-08 DIAGNOSIS — F988 Other specified behavioral and emotional disorders with onset usually occurring in childhood and adolescence: Secondary | ICD-10-CM

## 2018-08-08 DIAGNOSIS — R480 Dyslexia and alexia: Secondary | ICD-10-CM

## 2018-08-08 DIAGNOSIS — Z95828 Presence of other vascular implants and grafts: Secondary | ICD-10-CM

## 2018-08-08 DIAGNOSIS — A5271 Late syphilitic oculopathy: Secondary | ICD-10-CM

## 2018-08-08 LAB — CBC
HCT: 40.3 % (ref 36.0–46.0)
Hemoglobin: 12.7 g/dL (ref 12.0–15.0)
MCH: 27 pg (ref 26.0–34.0)
MCHC: 31.5 g/dL (ref 30.0–36.0)
MCV: 85.6 fL (ref 80.0–100.0)
NRBC: 0 % (ref 0.0–0.2)
PLATELETS: 521 10*3/uL — AB (ref 150–400)
RBC: 4.71 MIL/uL (ref 3.87–5.11)
RDW: 15.9 % — AB (ref 11.5–15.5)
WBC: 8.1 10*3/uL (ref 4.0–10.5)

## 2018-08-08 LAB — CREATININE, SERUM
CREATININE: 0.8 mg/dL (ref 0.44–1.00)
GFR calc Af Amer: >60 mL/min (ref >60)

## 2018-08-08 MED ORDER — SODIUM CHLORIDE 0.9% FLUSH
10.0000 mL | Freq: Two times a day (BID) | INTRAVENOUS | Status: DC
  Administered 2018-08-08 – 2018-08-09 (×3): 10 mL

## 2018-08-08 MED ORDER — ALPRAZOLAM 0.25 MG PO TABS
0.2500 mg | ORAL_TABLET | Freq: Three times a day (TID) | ORAL | Status: DC | PRN
  Administered 2018-08-08 – 2018-08-09 (×2): 0.25 mg via ORAL
  Filled 2018-08-08 (×2): qty 1

## 2018-08-08 MED ORDER — OXYCODONE HCL 5 MG PO TABS
5.0000 mg | ORAL_TABLET | Freq: Four times a day (QID) | ORAL | Status: DC | PRN
  Administered 2018-08-08: 23:00:00 5 mg via ORAL
  Filled 2018-08-08: qty 1

## 2018-08-08 MED ORDER — SODIUM CHLORIDE 0.9% FLUSH: 10.0000 mL | INTRAVENOUS | Status: DC | PRN

## 2018-08-08 NOTE — Progress Notes (Signed)
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  Pt has left eye blurry vision.  REVIEW OF SYSTEMS:  CONSTITUTIONAL: No fever, fatigue or weakness.  EYES: left eye blurry vision. EARS, NOSE, AND THROAT: No tinnitus or ear pain.  RESPIRATORY: No cough, shortness of breath, wheezing or hemoptysis.  CARDIOVASCULAR: No chest pain, orthopnea, edema.  GASTROINTESTINAL: No nausea, vomiting, diarrhea or abdominal pain.  GENITOURINARY: No dysuria, hematuria.  ENDOCRINE: No polyuria, nocturia,  HEMATOLOGY: No anemia, easy bruising or bleeding SKIN: No rash or lesion. MUSCULOSKELETAL: No joint pain or arthritis.   NEUROLOGIC: No tingling, numbness, weakness.  PSYCHIATRY: No anxiety or depression.   DRUG ALLERGIES:   Allergies  Allergen Reactions  . Percocet [Oxycodone-Acetaminophen] Itching    VITALS:  Blood pressure 105/64, pulse (!) 53, temperature 97.7 F (36.5 C), temperature source Oral, resp. rate 12, height 5\' 6"  (1.676 m), weight 82 kg, last menstrual period 07/28/2018, SpO2 99 %.  PHYSICAL EXAMINATION:  GENERAL:  32 y.o.-year-old patient lying in the bed with no acute distress.  EYES: Pupils equal, round, reactive to light and accommodation.  Left eye with blurry vision no scleral icterus. Extraocular muscles intact.  HEENT: Head atraumatic, normocephalic. Oropharynx and nasopharynx clear.  NECK:  Supple, no jugular venous distention. No thyroid enlargement, no tenderness.  LUNGS: Normal breath sounds bilaterally, no wheezing, rales,rhonchi or crepitation. No use of accessory muscles of respiration.  CARDIOVASCULAR: S1, S2 normal. No murmurs, rubs, or gallops.  ABDOMEN: Soft, nontender,  nondistended. Bowel sounds present. No organomegaly or mass.  EXTREMITIES: No pedal edema, cyanosis, or clubbing.  NEUROLOGIC: Cranial nerves II through XII are intact. Muscle strength 5/5 in all extremities. Sensation intact. Gait not checked.  PSYCHIATRIC: The patient is alert and oriented x 3.  SKIN: No obvious rash, lesion, or ulcer.    LABORATORY PANEL:   CBC Recent Labs  Lab 08/08/18 0908  WBC 8.1  HGB 12.7  HCT 40.3  PLT 521*   ------------------------------------------------------------------------------------------------------------------  Chemistries  Recent Labs  Lab 08/08/18 0908  CREATININE 0.80   ------------------------------------------------------------------------------------------------------------------  Cardiac Enzymes No results for input(s): TROPONINI in the last 168 hours. ------------------------------------------------------------------------------------------------------------------  RADIOLOGY:  [place] Ekg Site Rite  Result Date: 08/08/2018 If Site Rite image not attached, placement could not be confirmed due to current cardiac rhythm.   EKG:  No orders found for this or any previous visit.  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 1.  Syphilis: Ocular.  Penicillin G 4,000,000 units IV every 4 hours for 14 days until 08/15/18.  Patient needs PICC line placement. At the end of 14 days we need to arrange the health department to administer a single dose of penicillin G benzathine 2.4 MU IM x1  Will need to check RPR in 3 months to see whether titer  declining per Dr. Joylene Draft.  2.  Uveitis: continue prednisone drops as well as atropine drops 3.  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  Fioricet is helping continue the same as needed  4.  Overweight: BMI is 29.1 MI: Encourage healthy diet and exercise 5.  DVT prophylaxis: Lovenox  Discussed with Dr. Joylene Draft.  Full code All  the  records are reviewed and case discussed with Care Management/Social Workerr. Management plans discussed with the patient, family and they are in agreement.  CODE STATUS: fc   TOTAL TIME TAKING CARE OF THIS PATIENT: 27 minutes.   POSSIBLE D/C IN 1-2 DAYS, DEPENDING ON CLINICAL CONDITION.  Note: This dictation was prepared with Dragon dictation along with smaller phrase technology. Any transcriptional errors that result from this process are unintentional.   Shaune PollackQing Keeven Matty M.D on 08/08/2018 at 2:28 PM  Between 7am to 6pm - Pager - [phone] After 6pm go to [email or web address] - password [email or web address]  [facility]  Office  [phone]  CC: Primary care physician; Patient, No Pcp Per

## 2018-08-08 NOTE — Progress Notes (Signed)
PHARMACY CONSULT NOTE FOR:  OUTPATIENT  PARENTERAL ANTIBIOTIC THERAPY (OPAT)  Indication: Ocular syphilis Regimen: Penicillin G 24 million units infused intravenously, continuously over 24 hours End date: 08/15/2018  IV antibiotic discharge orders are pended. To discharging provider:  please sign these orders via discharge navigator,  Select New Orders & click on the button choice - Manage This Unsigned Work.     Thank you for allowing pharmacy to be a part of this patient's care.  Carola FrostNathan A Samariah Hokenson, Pharm.D., BCPS Clinical Pharmacist 08/08/2018, 1:24 PM

## 2018-08-08 NOTE — Care Management Note (Signed)
Case Management Note  Patient Details  Name: Lori Bryant MRN: [id number] Date of Birth: 05-20-1986  Subjective/Objective:      Admitted to [facility] with the diagnosis of ocular syphilis. Lives with boyfriend, Lori Bryant. Aunt is Lori Bryant [phone]). [place]oved [place] in September. Been working at [employer] since September.  Pres[facility] at [facility] on [address].               Action/Plan: [facility] and Med. Management application given. May need PICC and Long term Antibiotics per Dr. Imogene Burnhen   Expected Discharge Date:                  Expected Discharge Plan:     In-House Referral:   yes  Discharge planning Services   yes  Post Acute Care Choice:    Choice offered to:     DME Arranged:    DME Agency:     HH Arranged:    HH Agency:     Status of Service:     If discussed at Long Length of Stay Meetings, dates discussed:    Additional Comments:  Lori GreetBrenda S Ola Raap, RN MSN CCM Care Management [phone] 08/08/2018, 11:01 AM

## 2018-08-08 NOTE — Treatment Plan (Signed)
Diagnosis: Ocular syphilis Baseline Creatinine <1    Allergies  Allergen Reactions  . Percocet [Oxycodone-Acetaminophen] Itching    OPAT Orders Discharge antibiotics:Penicillin G 4 million units every 4 hours or 24 milllion units every 24 hrs- infusion pump End Date: 08/15/18 ( she will complete 2 weeks)  [facility] Per Protocol:  Labs Friday while on IV antibiotics: _X_ CBC with differential _X_ CMP   Please pull PIC at completion of IV antibiotics   Fax weekly labs to [phone]  Clinic Follow Up Appt: 3rd week of february Call [phone] to make the appt  Need to get Benzathine penicillin 2.4 million units IM on 11/26 or after at Health Dept

## 2018-08-08 NOTE — Consult Note (Signed)
NAME: Lori Bryant  DOB: 1985-12-06  MRN: [id number]  Date/Time: 08/08/2018 11:59 AM Subjective:  REASON FOR CONSULT:  ? Lori Bryant is a 32 y.o.female  with no significant PMH was admitted to [facility]  Between 11/7-11/9/19 for left eye endopthalmitis and orbital cellulitis and was on vanco and unasyn and then transferred to [facility] for furtehr management- investigation there revealed RPR 1:64 and LP was done which was normal for cell count and protein- VDRL pending- Vitrous fluid was sent for toxo PCR and it was neg- Bartonella, lyme, TB, HLAB27, ACE, HIV ,  VZV PCR ( from left eye ) HSV  Toxoplasma, histoplasma, were all negative. She was started on penicillin for ocular syphilis / secondary syphilis as she had  lesions on her palms as well. Autoantibodies work up came positive for ANA 1:160 speckled. She has been transferred back to [facility] to complete treatment- She is on 4 million units every 4 hrs and end date is 08/15/18. After that she is to get benzathine penicillin 2.4 million unit 1 dose at the DEPT of health.  She has had a palmar/plantar rash since September and she thought it was eczema/psoriasis. A day  before her presentation to [facility]  On 11/7 she developed pink eye and then swelling and blurred vision.She has not had STD's in the past. No blood transfusion, no pregnancies.No IVDA or illegal substance use Pt currently lives with her boyfriend of 5 months. She has had 2 previous boyfriends. She has moved around different states- [place], [place], [place] , [place] She has been homeless at various stages and stayed with frineds She says she has dyslexia and ADD and has been on disability in the past. She courrently was working at [employer] Has been in hospital for ankle sprain and wrist sprain due to fall in the past   PMH gall stones Ankle sprain  PSH cholecystectomy    Family History  Family history unknown: Yes   Allergies  Allergen Reactions  . Percocet [Oxycodone-Acetaminophen]  Itching    ? Current Facility-Administered Medications  Medication Dose Route Frequency Provider Last Rate Last Dose  . acetaminophen (TYLENOL) tablet 650 mg  650 mg Oral Q6H PRN Arnaldo Natal, MD       Or  . acetaminophen (TYLENOL) suppository 650 mg  650 mg Rectal Q6H PRN Arnaldo Natal, MD      . atropine 1 % ophthalmic solution 1 drop  1 drop Left Eye BID Arnaldo Natal, MD   1 drop at 08/08/18 0941  . butalbital-acetaminophen-caffeine (FIORICET, ESGIC) 50-325-40 MG per tablet 1 tablet  1 tablet Oral Q6H PRN Arnaldo Natal, MD   1 tablet at 08/08/18 0535  . docusate sodium (COLACE) capsule 100 mg  100 mg Oral BID Arnaldo Natal, MD   100 mg at 08/08/18 0940  . enoxaparin (LOVENOX) injection 40 mg  40 mg Subcutaneous Q24H Arnaldo Natal, MD      . ondansetron [facility]) tablet 4 mg  4 mg Oral Q6H PRN Arnaldo Natal, MD       Or  . ondansetron [facility]) injection 4 mg  4 mg Intravenous Q6H PRN Arnaldo Natal, MD   4 mg at 08/07/18 2334  . penicillin G potassium 12 million units in dextrose 5% 500 ml infusion   Intravenous Continuous Ramonita Lab, MD 42 mL/hr at 08/08/18 0538    . prednisoLONE acetate (PRED FORTE) 1 % ophthalmic suspension 1 drop  1 drop Left Eye Q2H while awake Diamond,  Kelton PillarMichael S, MD   1 drop at 08/08/18 1138     Abtx:  Anti-infectives (From admission, onward)   Start     Dose/Rate Route Frequency Ordered Stop   08/07/18 1400  penicillin G potassium 4 Million Units in dextrose 5 % 250 mL IVPB  Status:  Discontinued     4 Million Units 250 mL/hr over 60 Minutes Intravenous Every 4 hours 08/07/18 1115 08/07/18 1132   08/07/18 1200  penicillin G potassium 12 million units in dextrose 5% 500 ml infusion     42 mL/hr over 12 Hours Intravenous Continuous 08/07/18 1132     08/07/18 0445  penicillin G 3 million units in sodium chloride 0.9% 100 mL IVPB  Status:  Discontinued     3 Million Units 200 mL/hr over 30 Minutes Intravenous Every 4 hours  08/07/18 0410 08/07/18 1115      REVIEW OF SYSTEMS:  Const: negative fever, negative chills, negative weight loss Eyes: blurring of vision left eye with some redness ENT: negative coryza, negative sore throat Resp: negative cough, hemoptysis, dyspnea Cards: negative for chest pain, palpitations, lower extremity edema GU: negative for frequency, dysuria and hematuria GI: Negative for abdominal pain, diarrhea, bleeding, constipation Skin: rash palms and soles Heme: negative for easy bruising and gum/nose bleeding MS: negative for myalgias, arthralgias, back pain and muscle weakness Neurolo:negative for headaches, dizziness, vertigo, memory problems  Psych: negative for feelings of anxiety, depression  Endocrine: negative for thyroid, diabetes Allergy/Immunology- negative for any medication or food allergies ? Pertinent Positives include : Objective:  VITALS:  BP 105/64 (BP Location: Left Arm)   Pulse (!) 53   Temp 97.7 F (36.5 C) (Oral)   Resp 12   Ht 5\' 6"  (1.676 m)   Wt 82 kg   LMP 07/28/2018   SpO2 99%   BMI 29.18 kg/m  PHYSICAL EXAM:  General: Alert, cooperative, no distress, appears stated age.  Head: Normocephalic, without obvious abnormality, atraumatic. Eyes: Conjunctivae injected , left pupil dilated ENT Nares normal. No drainage or sinus tenderness. Lips, mucosa, and tongue normal. No Thrush Neck: Supple, symmetrical, no adenopathy, thyroid: non tender no carotid bruit and no JVD. Back: No CVA tenderness. Lungs: Clear to auscultation bilaterally. No Wheezing or Rhonchi. No rales. Heart: Regular rate and rhythm, no murmur, rub or gallop. Abdomen: Soft, non-tender,not distended. Bowel sounds normal. No masses Extremities: atraumatic, no cyanosis. No edema. No clubbing Skin: palmar plantar rash healing Lymph: Cervical, supraclavicular normal. Neurologic: Grossly non-focal Pertinent Labs Lab Results CBC    Component Value Date/Time   WBC 8.1 08/08/2018 0908    RBC 4.71 08/08/2018 0908   HGB 12.7 08/08/2018 0908   HCT 40.3 08/08/2018 0908   PLT 521 (H) 08/08/2018 0908   MCV 85.6 08/08/2018 0908   MCH 27.0 08/08/2018 0908   MCHC 31.5 08/08/2018 0908   RDW 15.9 (H) 08/08/2018 0908    CMP Latest Ref Rng & Units 08/08/2018 07/30/2018 07/29/2018  Glucose 70 - 99 mg/dL - - 81  BUN 6 - 20 mg/dL - - 9  Creatinine 1.610.44 - 1.00 mg/dL 0.960.80 0.450.75 4.090.86  Sodium 135 - 145 mmol/L - - 137  Potassium 3.5 - 5.1 mmol/L - - 3.7  Chloride 98 - 111 mmol/L - - 104  CO2 22 - 32 mmol/L - - 28  Calcium 8.9 - 10.3 mg/dL - - 8.4(L)      Microbiology: No results found for this or any previous visit (from the past 240 hour(s)). IMAGING  RESULTS: ? Impression/Recommendation ? 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  Penicillin G 4 million units every 4 hours or 24 million units in 24 hr infusion until 08/15/18 At the end of 14 days, administer a single dose of PCN G benzathine 2,400, 000 units IM x1 at Dept of health clinic Will need to check RPR in 3 months to see whether titer  declining Partner notification and treatment- [facility] is following with her Pt requests calming med for  PICC  ?continue atropine BID and predforte eyedrops  6x daily until seen by ophthalmologist as OP  ___follow up with ID feb 25th or around  ( call [phone] to make an appt) ________________________________________________ Discussed with patient in great detail

## 2018-08-08 NOTE — Progress Notes (Signed)
Peripherally Inserted Central Catheter/Midline Placement  The IV Nurse has discussed with the patient and/or persons authorized to consent for the patient, the purpose of this procedure and the potential benefits and risks involved with this procedure.  The benefits include less needle sticks, lab draws from the catheter, and the patient may be discharged home with the catheter. Risks include, but not limited to, infection, bleeding, blood clot (thrombus formation), and puncture of an artery; nerve damage and irregular heartbeat and possibility to perform a PICC exchange if needed/ordered by physician.  Alternatives to this procedure were also discussed.  Bard Power PICC patient education guide, fact sheet on infection prevention and patient information card has been provided to patient /or left at bedside.    PICC/Midline Placement Documentation  PICC Single Lumen 08/08/18 PICC Right Brachial 35 cm 0 cm (Active)  Indication for Insertion or Continuance of Line Home intravenous therapies (PICC only) 08/08/2018  3:38 PM  Exposed Catheter (cm) 0 cm 08/08/2018  3:38 PM  Site Assessment Clean;Dry;Intact 08/08/2018  3:38 PM  Line Status Flushed;Saline locked;Blood return noted 08/08/2018  3:38 PM  Dressing Type Transparent 08/08/2018  3:38 PM  Dressing Status Clean;Dry;Intact;Antimicrobial disc in place 08/08/2018  3:38 PM  Dressing Change Due 08/15/18 08/08/2018  3:38 PM       Ethelda Chickurrie, Magnus Crescenzo Robert 08/08/2018, 3:40 PM

## 2018-08-09 LAB — HEPATITIS PANEL, ACUTE
HCV Ab: <0.1 s/co ratio (ref 0.0–0.9)
HEP A IGM: NEGATIVE
HEP B C IGM: NEGATIVE
HEP B S AG: NEGATIVE

## 2018-08-09 MED ORDER — PENICILLIN G POTASSIUM IV (FOR PTA / DISCHARGE USE ONLY): 24.0000 10*6.[IU] | INTRAVENOUS | 0 refills | Status: AC

## 2018-08-09 NOTE — Discharge Summary (Signed)
[facility] - [place] at [facility]   PATIENT NAME: Lori Bryant    MR#:  [id number]  DATE OF BIRTH:  10/02/1985  DATE OF ADMISSION:  08/07/2018   ADMITTING PHYSICIAN: Enid Baas, MD  DATE OF DISCHARGE: 08/09/2018  PRIMARY CARE PHYSICIAN: Patient, No Pcp Per   ADMISSION DIAGNOSIS:  ocular syphilis DISCHARGE DIAGNOSIS:  Active Problems:   Ocular syphilis   Secondary syphilis  SECONDARY DIAGNOSIS:   Past Medical History:  Diagnosis Date  . No pertinent past medical history    HOSPITAL COURSE:  This is a 32 year old female admitted for ocular syphilis. 1. Syphilis: Ocular. Penicillin G 4,000,000 units IV every 4 hours for 14 days until 08/15/18.  Patient needs PICC line placement. At the end of 14 days we need to arrange the health department to administer a single dose of penicillin G benzathine 2.4 MU IM x1  Will need to check RPR in 3 months to see whether titer declining per Dr. Joylene Draft.  [facility] will be providing home health services and IV antibiotics.  2. Uveitis: continue prednisone drops as well as atropine drops 3. 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  Fioricet is helping continue the same as needed 4. Overweight: BMI is 29.1 MI: Encourage healthy diet and exercise DISCHARGE CONDITIONS:  Stable, discharge to home with home health today. CONSULTS OBTAINED:  Treatment Team:  Judyann Munson, MD Shaune Pollack, MD DRUG ALLERGIES:   Allergies  Allergen Reactions  . Percocet [Oxycodone-Acetaminophen] Itching   DISCHARGE MEDICATIONS:   Allergies as of 08/09/2018      Reactions   Percocet [oxycodone-acetaminophen] Itching      Medication List    TAKE these medications   acetaminophen 325 MG tablet Commonly known as:  TYLENOL Take 650 mg by mouth every 4 (four) hours as needed for mild pain, fever or headache.   alum & mag hydroxide-simeth 200-200-20 MG/5ML suspension Commonly known as:   MAALOX/MYLANTA Take 30 mLs by mouth every 6 (six) hours as needed for indigestion or heartburn.   atropine 1 % ophthalmic solution Place 1 drop into the left eye 2 (two) times daily.   butalbital-acetaminophen-caffeine 50-325-40 MG tablet Commonly known as:  FIORICET, ESGIC Take 1 tablet by mouth every 6 (six) hours as needed for headache.   ibuprofen 600 MG tablet Commonly known as:  ADVIL,MOTRIN Take 600 mg by mouth every 6 (six) hours as needed for fever, headache or mild pain.   ondansetron 4 MG disintegrating tablet Commonly known as:  ZOFRAN-ODT Take 4 mg by mouth every 12 (twelve) hours as needed for nausea or vomiting.   pantoprazole 40 MG tablet Commonly known as:  PROTONIX Take 40 mg by mouth daily.   penicillin G  IVPB Inject 24 Million Units into the vein continuous for 7 days. Indication:  Ocular syphilis Last Day of Therapy:  08/15/2018 Labs - Once weekly:  CBC/D and CMP every Friday Pull PICC when therapy completed What changed:    how much to take  when to take this  additional instructions   prednisoLONE acetate 1 % ophthalmic suspension Commonly known as:  PRED FORTE Place 1 drop into the left eye 6 (six) times daily.            Home Infusion Instuctions  (From admission, onward)         Start     Ordered   08/09/18 0000  Home infusion instructions [facility] May follow [facility] Dosing Protocol; May  administer Cathflo as needed to maintain patency of vascular access device.; Flushing of vascular access device: per [facility] Protocol: 0.9% NaCl pre/post medica...    Question Answer Comment  Instructions May follow [facility] Dosing Protocol   Instructions May administer Cathflo as needed to maintain patency of vascular access device.   Instructions Flushing of vascular access device: per [facility] Protocol: 0.9% NaCl pre/post medication administration and prn patency; Heparin 100 u/ml, 5ml for implanted ports and Heparin 10u/ml, 5ml for all  other central venous catheters.   Instructions May follow [facility] Anaphylaxis Protocol for First Dose Administration in the home: 0.9% NaCl at 25-50 ml/hr to maintain IV access for protocol meds. Epinephrine 0.3 ml IV/IM PRN and Benadryl 25-50 IV/IM PRN s/s of anaphylaxis.   Instructions [facility] Infusion Coordinator (RN) to assist per patient IV care needs in the home PRN.      08/09/18 0644           DISCHARGE INSTRUCTIONS:  See AVS. If you experience worsening of your admission symptoms, develop shortness of breath, life threatening emergency, suicidal or homicidal thoughts you must seek medical attention immediately by calling 911 or calling your MD immediately  if symptoms less severe.  You Must read complete instructions/literature along with all the possible adverse reactions/side effects for all the Medicines you take and that have been prescribed to you. Take any new Medicines after you have completely understood and accpet all the possible adverse reactions/side effects.   Please note  You were cared for by a hospitalist during your hospital stay. If you have any questions about your discharge medications or the care you received while you were in the hospital after you are discharged, you can call the unit and asked to speak with the hospitalist on call if the hospitalist that took care of you is not available. Once you are discharged, your primary care physician will handle any further medical issues. Please note that NO REFILLS for any discharge medications will be authorized once you are discharged, as it is imperative that you return to your primary care physician (or establish a relationship with a primary care physician if you do not have one) for your aftercare needs so that they can reassess your need for medications and monitor your lab values.    On the day of Discharge:  VITAL SIGNS:  Blood pressure (!) 92/59, pulse 64, temperature 98.7 F (37.1 C), resp. rate  18, height 5\' 6"  (1.676 m), weight 80.5 kg, last menstrual period 07/28/2018, SpO2 97 %. PHYSICAL EXAMINATION:  GENERAL:  32 y.o.-year-old patient lying in the bed with no acute distress.  EYES: Pupils equal, round, reactive to light and accommodation. No scleral icterus. Extraocular muscles intact.  HEENT: Head atraumatic, normocephalic. Oropharynx and nasopharynx clear.  NECK:  Supple, no jugular venous distention. No thyroid enlargement, no tenderness.  LUNGS: Normal breath sounds bilaterally, no wheezing, rales,rhonchi or crepitation. No use of accessory muscles of respiration.  CARDIOVASCULAR: S1, S2 normal. No murmurs, rubs, or gallops.  ABDOMEN: Soft, non-tender, non-distended. Bowel sounds present. No organomegaly or mass.  EXTREMITIES: No pedal edema, cyanosis, or clubbing.  NEUROLOGIC: Cranial nerves II through XII are intact. Muscle strength 5/5 in all extremities. Sensation intact. Gait not checked.  PSYCHIATRIC: The patient is alert and oriented x 3.  SKIN: No obvious rash, lesion, or ulcer.  DATA REVIEW:   CBC Recent Labs  Lab 08/08/18 0908  WBC 8.1  HGB 12.7  HCT 40.3  PLT 521*  Chemistries  Recent Labs  Lab 08/08/18 0908  CREATININE 0.80     Microbiology Results  No results found for this or any previous visit.  RADIOLOGY:  No results found.   Management plans discussed with the patient, family and they are in agreement.  CODE STATUS: Full Code   TOTAL TIME TAKING CARE OF THIS PATIENT: 32 minutes.    Shaune PollackQing Blakely Gluth M.D on 08/09/2018 at 1:46 PM  Between 7am to 6pm - Pager - [phone]  After 6pm go to [email or web address] - Social research officer, governmentpassword [email or web address]  [facility] [place] [facility]  Office  [phone]  CC: Primary care physician; Patient, No Pcp Per   Note: This dictation was prepared with Dragon dictation along with smaller phrase technology. Any transcriptional errors that result from this process are unintentional.

## 2018-08-09 NOTE — Discharge Instructions (Signed)
Penicillin G 4 million units every 4 hours or 24 million units in 24 hr infusion until 08/15/18 At the end of 14 days, administer a single dose of PCN G benzathine 2,400, 000 units [facility] Will need to check RPR in 3 months to see whether titer  declining Partner notification and treatment- [facility] is following with her continue atropine BID and predforte eyedrops  6x daily until seen by ophthalmologist as OP follow up with ID feb 25th or around  ( call [phone] to make an appt)  Home health for IV abx.

## 2018-08-09 NOTE — Care Management (Addendum)
Spoke with Johnell ComingsHolly Green, [facility] representative. Scheduled appointment at [facility] 08/16/18. 5:45pm.  [facility] will be providing home health services and IV antibiotics. Transportation home will be arranged per boyfriend, Chanetta MarshallJimmy. Discharge to home today per Dr. Reina Fusehen Dionicia Cerritos S Tzivia Oneil RN MSN CCM Care Management [phone]

## 2018-08-09 NOTE — Discharge Planning (Signed)
Patient IV removed.  RN assessment and VS [facility].  Discharge papers given, explained and educated.  Informed of suggested FU appt and appt made except [facility] Dr. (office agreed to contact patinet to set up and patient agreed to contact if not contacted to set up.  Printed script given to patient for IV anbx.  Once ready, will be walked to front and boyfriend transporting home via car

## 2018-08-09 NOTE — Progress Notes (Signed)
ID address: [address], [place], [place] [id number]   Lori CaseyJason E Bryant 08/09/2018, 11:52 AM

## 2018-08-09 NOTE — Progress Notes (Signed)
[facility]  [phone] Alan MulderJimmy Strader  Emergency Contact   If patient discharges after hours, please call [phone].   Scherrie GerlachJason E Hinton 08/09/2018, 10:04 AM

## 2018-08-09 NOTE — Plan of Care (Signed)
  Problem: Education: Goal: Knowledge of General Education information will improve Description Including pain rating scale, medication(s)/side effects and non-pharmacologic comfort measures Outcome: Progressing   Problem: Health Behavior/Discharge Planning: Goal: Ability to manage health-related needs will improve Outcome: Progressing   Problem: Clinical Measurements: Goal: Ability to maintain clinical measurements within normal limits will improve Outcome: Progressing Goal: Will remain free from infection Outcome: Progressing Goal: Diagnostic test results will improve Outcome: Progressing Goal: Respiratory complications will improve Outcome: Progressing Goal: Cardiovascular complication will be avoided Outcome: Progressing   Problem: Activity: Goal: Risk for activity intolerance will decrease Outcome: Progressing   Problem: Nutrition: Goal: Adequate nutrition will be maintained Outcome: Progressing   Problem: Nutrition: Goal: Adequate nutrition will be maintained Outcome: Progressing   Problem: Coping: Goal: Level of anxiety will decrease Outcome: Progressing   Problem: Coping: Goal: Level of anxiety will decrease Outcome: Progressing   Problem: Elimination: Goal: Will not experience complications related to bowel motility Outcome: Progressing Goal: Will not experience complications related to urinary retention Outcome: Progressing   Problem: Pain Managment: Goal: General experience of comfort will improve Outcome: Progressing   Problem: Safety: Goal: Ability to remain free from injury will improve Outcome: Progressing   Problem: Skin Integrity: Goal: Risk for impaired skin integrity will decrease Outcome: Progressing

## 2018-08-16 ENCOUNTER — Ambulatory Visit: Payer: Self-pay

## 2019-03-21 IMAGING — CT CT ORBITS W/ CM
3 of 4 series · 14 of 47 positions shown, 16 images · IV contrast (omnipaque)
Comparison: None.

CLINICAL DATA: LEFT eye swelling and drainage for 2 days. Headache.

EXAM:
CT ORBITS WITH CONTRAST
TECHNIQUE: Multidetector CT images was performed according to the standard
protocol following intravenous contrast administration.
CONTRAST:  75mL OMNIPAQUE IOHEXOL 300 MG/ML  SOLN

[Series 3: orbits 2.0 h30s st · axial · 0.33mm/px · z∈[+326,+412]mm · 8 of 51 slices shown, 10 images]
[im 4/51  brain]
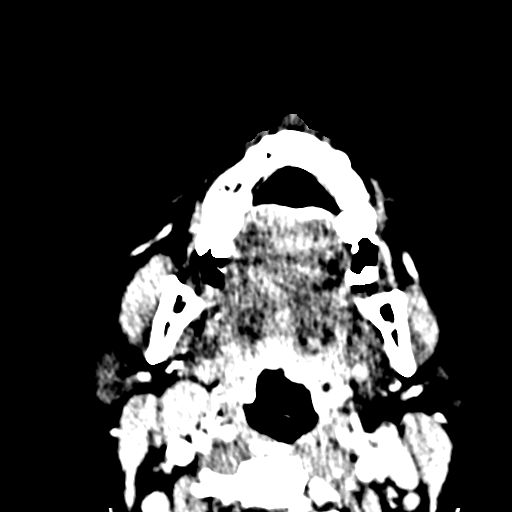
[im 4/51  bone]
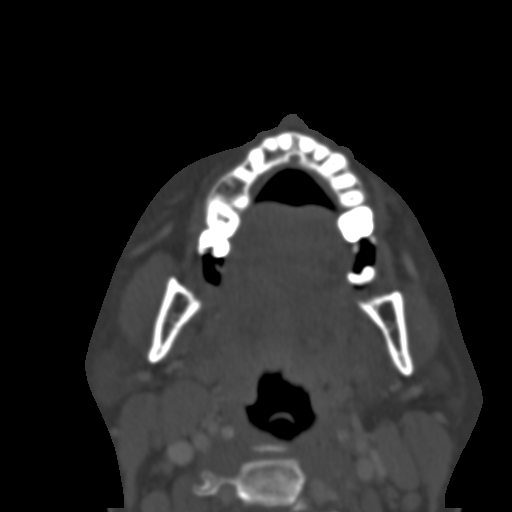
[im 11/51  bone]
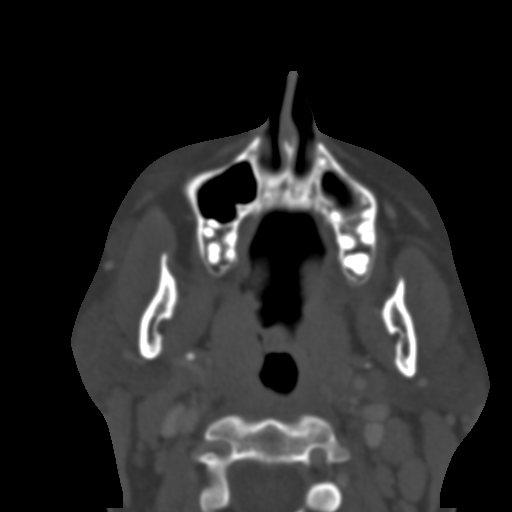
[im 16/51  bone]
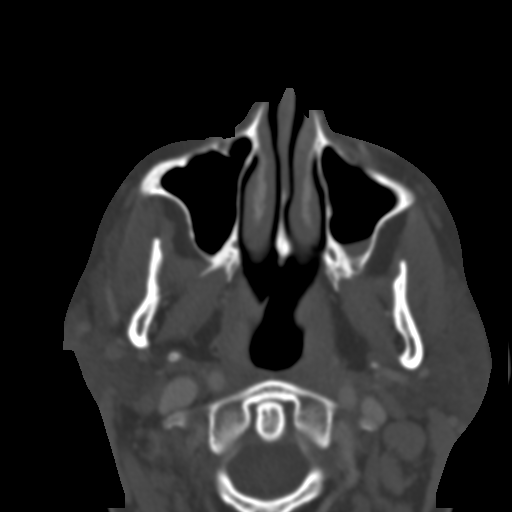
[im 23/51  bone]
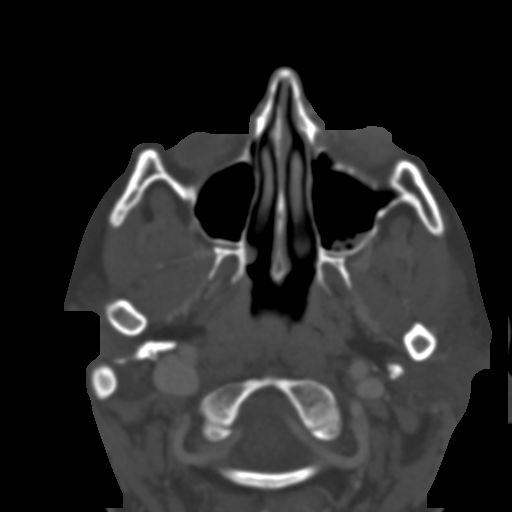
[im 28/51  brain]
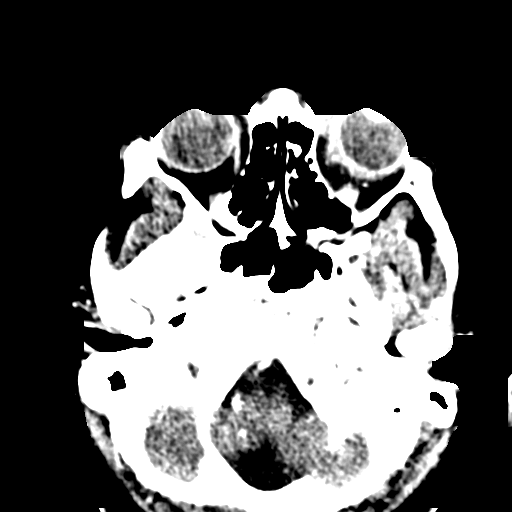
[im 28/51  bone]
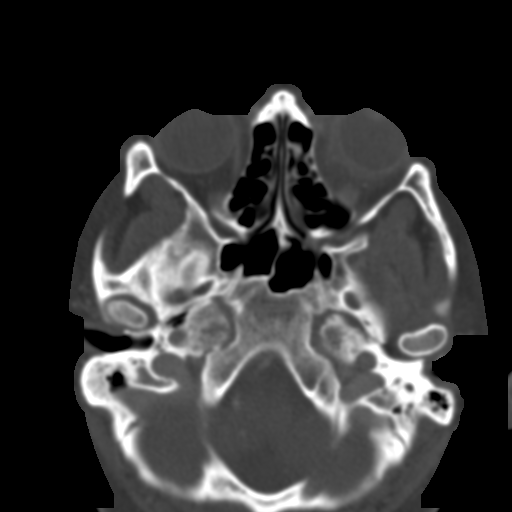
[im 35/51  bone]
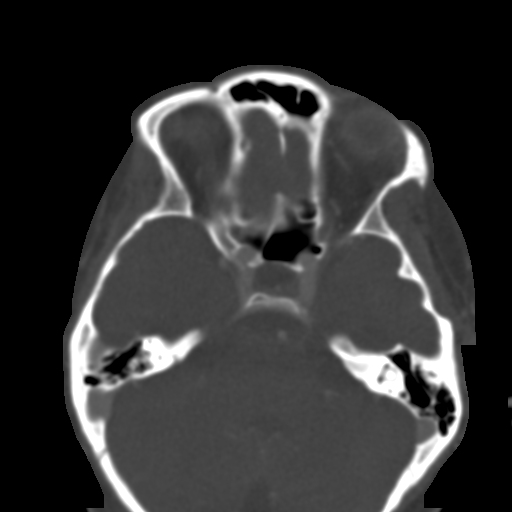
[im 40/51  bone]
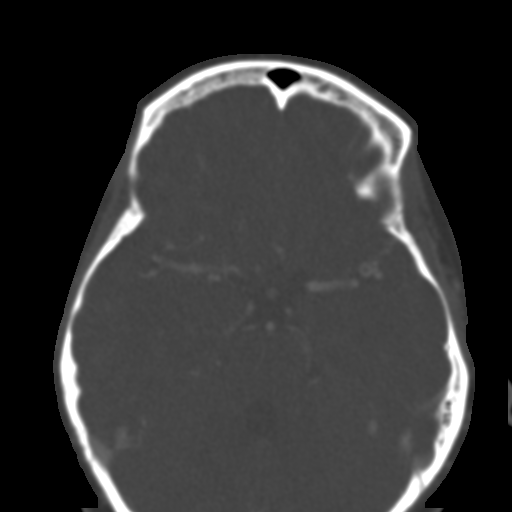
[im 47/51  bone]
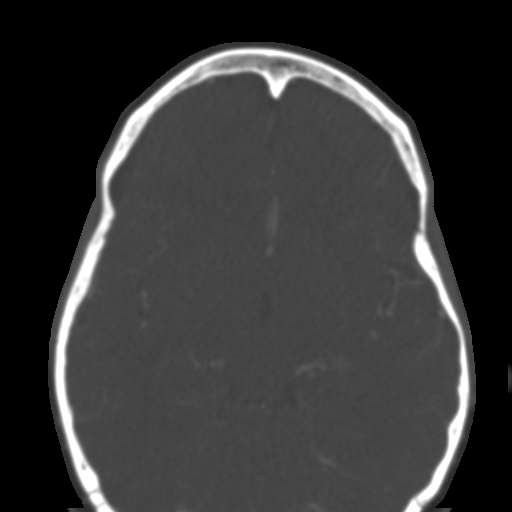

[Series 4: orbits 2.0 coronal · coronal · 0.25mm/px · 3 of 70 slices shown]
[im 18/70  bone]
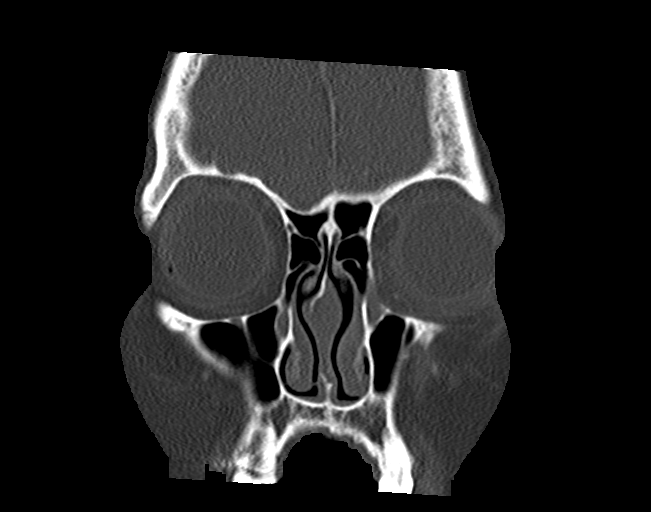
[im 35/70  bone]
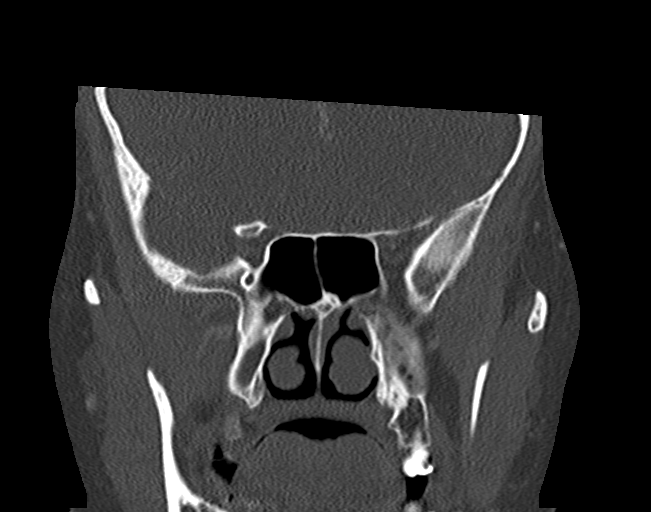
[im 52/70  bone]
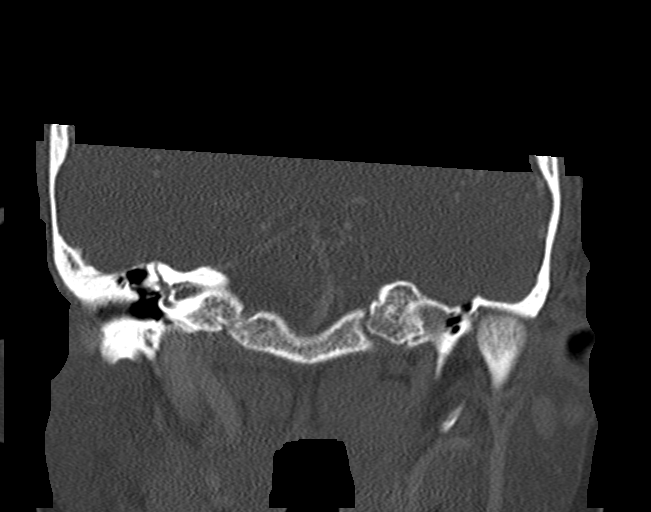

[Series 9: orbits 2.0 sagittal · sagittal · 0.21mm/px · 3 of 74 slices shown]
[im 25/74  bone]
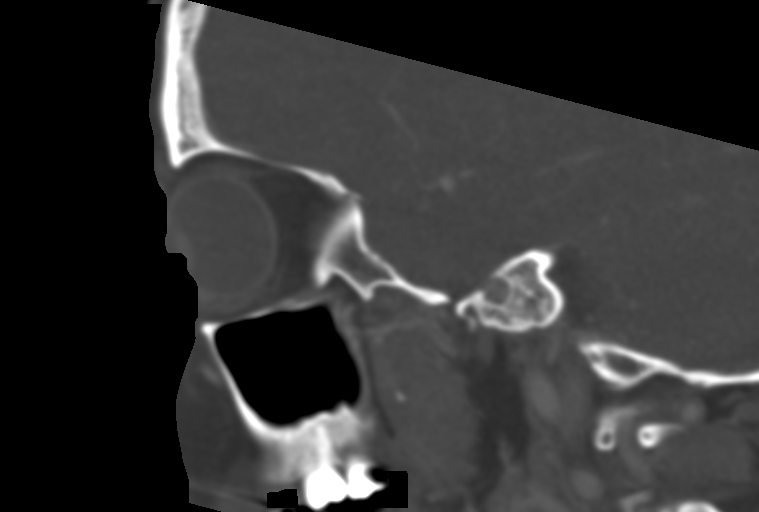
[im 37/74  bone]
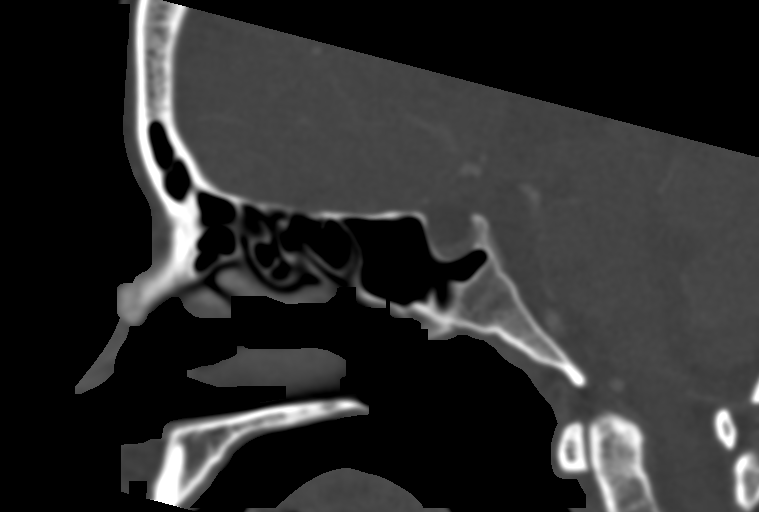
[im 49/74  bone]
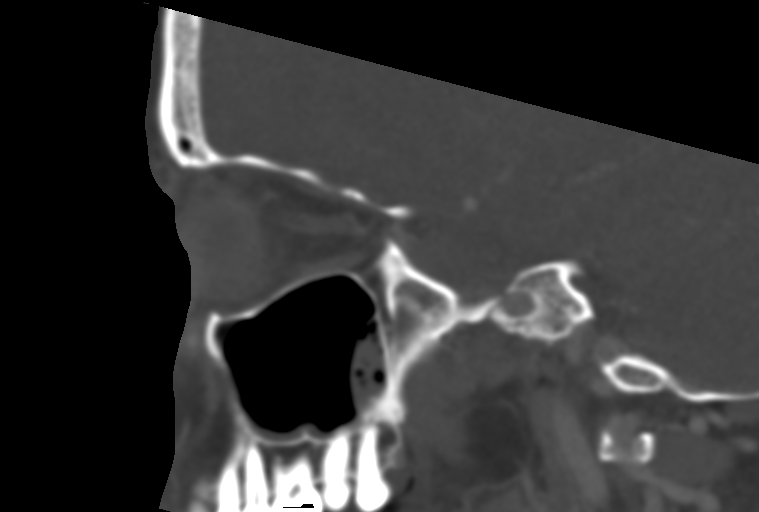

[14 of 47 positions shown; findings below may reference images not displayed]

FINDINGS: ORBITS: Intact ocular globes. Lenses are located. LEFT scleral
enhancement and mild infiltration of the extraconal and retrobulbar
are fat. Mildly enlarged an indistinct LEFT optic nerve sheath
complex. Preservation of the orbital fat. Normal appearance of the
extraocular muscles which are well located. Superior ophthalmic
veins are not enlarged.

VISUALIZED SINUSES: Mild LEFT maxillary sinus mucosal thickening
with air-fluid level. Trace RIGHT maxillary sinus mucosal
thickening.

SOFT TISSUES/BONES: No significant soft tissue swelling, no
subcutaneous gas or radiopaque foreign bodies. No destructive bony
lesions.

INTRACRANIAL CONTENTS: Normal.
IMPRESSION: 1. LEFT intraorbital cellulitis and conjunctivitis.  No abscess.
2. Mild sinusitis.
3. Acute findings discussed with and reconfirmed by Dr.Giorgi on
07/28/2018 at [DATE].
